# Patient Record
Sex: Male | Born: 1940 | Race: White | Hispanic: No | Marital: Married | Smoking: Never smoker
Health system: Southern US, Community
[De-identification: ages and names within clinical notes are randomized; demographics above are authoritative.]

## PROBLEM LIST (undated history)

## (undated) DIAGNOSIS — T7840XA Allergy, unspecified, initial encounter: Secondary | ICD-10-CM

## (undated) DIAGNOSIS — K219 Gastro-esophageal reflux disease without esophagitis: Secondary | ICD-10-CM

## (undated) DIAGNOSIS — K8020 Calculus of gallbladder without cholecystitis without obstruction: Secondary | ICD-10-CM

## (undated) DIAGNOSIS — I10 Essential (primary) hypertension: Secondary | ICD-10-CM

## (undated) DIAGNOSIS — E119 Type 2 diabetes mellitus without complications: Secondary | ICD-10-CM

## (undated) DIAGNOSIS — K635 Polyp of colon: Secondary | ICD-10-CM

## (undated) DIAGNOSIS — R011 Cardiac murmur, unspecified: Secondary | ICD-10-CM

## (undated) DIAGNOSIS — E785 Hyperlipidemia, unspecified: Secondary | ICD-10-CM

## (undated) DIAGNOSIS — C801 Malignant (primary) neoplasm, unspecified: Secondary | ICD-10-CM

## (undated) DIAGNOSIS — N189 Chronic kidney disease, unspecified: Secondary | ICD-10-CM

## (undated) DIAGNOSIS — M1990 Unspecified osteoarthritis, unspecified site: Secondary | ICD-10-CM

## (undated) DIAGNOSIS — I2510 Atherosclerotic heart disease of native coronary artery without angina pectoris: Secondary | ICD-10-CM

## (undated) HISTORY — DX: Allergy, unspecified, initial encounter: T78.40XA

## (undated) HISTORY — DX: Essential (primary) hypertension: I10

## (undated) HISTORY — DX: Calculus of gallbladder without cholecystitis without obstruction: K80.20

## (undated) HISTORY — DX: Hyperlipidemia, unspecified: E78.5

## (undated) HISTORY — DX: Polyp of colon: K63.5

## (undated) HISTORY — DX: Gastro-esophageal reflux disease without esophagitis: K21.9

---

## 1998-11-29 HISTORY — PX: CORONARY ANGIOPLASTY WITH STENT PLACEMENT: SHX49

## 2009-11-29 DIAGNOSIS — C801 Malignant (primary) neoplasm, unspecified: Secondary | ICD-10-CM

## 2009-11-29 HISTORY — DX: Malignant (primary) neoplasm, unspecified: C80.1

## 2010-04-23 ENCOUNTER — Inpatient Hospital Stay: Payer: Self-pay | Admitting: Internal Medicine

## 2010-06-29 ENCOUNTER — Ambulatory Visit: Payer: Self-pay | Admitting: Oncology

## 2010-07-08 ENCOUNTER — Inpatient Hospital Stay: Payer: Self-pay | Admitting: Surgery

## 2010-07-15 HISTORY — PX: COLON SURGERY: SHX602

## 2010-07-15 LAB — CEA: CEA: 4.6 ng/mL (ref 0.0–4.7)

## 2010-07-22 ENCOUNTER — Ambulatory Visit: Payer: Self-pay | Admitting: Oncology

## 2010-07-30 ENCOUNTER — Ambulatory Visit: Payer: Self-pay | Admitting: Oncology

## 2010-08-10 ENCOUNTER — Ambulatory Visit: Payer: Self-pay | Admitting: Oncology

## 2010-08-25 ENCOUNTER — Ambulatory Visit: Payer: Self-pay | Admitting: Surgery

## 2010-08-29 ENCOUNTER — Ambulatory Visit: Payer: Self-pay | Admitting: Oncology

## 2010-09-29 ENCOUNTER — Ambulatory Visit: Payer: Self-pay | Admitting: Oncology

## 2010-10-29 ENCOUNTER — Ambulatory Visit: Payer: Self-pay | Admitting: Oncology

## 2010-11-29 ENCOUNTER — Ambulatory Visit: Payer: Self-pay | Admitting: Oncology

## 2010-12-30 ENCOUNTER — Ambulatory Visit: Payer: Self-pay | Admitting: Oncology

## 2011-01-28 ENCOUNTER — Ambulatory Visit: Payer: Self-pay | Admitting: Oncology

## 2011-02-28 ENCOUNTER — Ambulatory Visit: Payer: Self-pay | Admitting: Oncology

## 2011-03-31 ENCOUNTER — Ambulatory Visit: Payer: Self-pay | Admitting: Oncology

## 2011-04-30 ENCOUNTER — Ambulatory Visit: Payer: Self-pay | Admitting: Oncology

## 2011-05-13 LAB — CEA: CEA: 2.4 ng/mL (ref 0.0–4.7)

## 2011-05-30 ENCOUNTER — Ambulatory Visit: Payer: Self-pay | Admitting: Oncology

## 2011-06-30 ENCOUNTER — Ambulatory Visit: Payer: Self-pay | Admitting: Oncology

## 2011-08-04 ENCOUNTER — Ambulatory Visit: Payer: Self-pay | Admitting: Oncology

## 2011-08-05 LAB — CEA: CEA: 2.3 ng/mL (ref 0.0–4.7)

## 2011-08-30 ENCOUNTER — Ambulatory Visit: Payer: Self-pay | Admitting: Oncology

## 2011-09-30 ENCOUNTER — Ambulatory Visit: Payer: Self-pay | Admitting: Oncology

## 2011-11-02 ENCOUNTER — Ambulatory Visit: Payer: Self-pay | Admitting: Gastroenterology

## 2011-11-02 DIAGNOSIS — K635 Polyp of colon: Secondary | ICD-10-CM

## 2011-11-02 HISTORY — DX: Polyp of colon: K63.5

## 2011-11-02 HISTORY — PX: COLONOSCOPY: SHX174

## 2011-11-05 ENCOUNTER — Ambulatory Visit: Payer: Self-pay | Admitting: Oncology

## 2011-11-30 ENCOUNTER — Ambulatory Visit: Payer: Self-pay | Admitting: Oncology

## 2011-12-31 ENCOUNTER — Ambulatory Visit: Payer: Self-pay | Admitting: Oncology

## 2012-02-03 ENCOUNTER — Ambulatory Visit: Payer: Self-pay | Admitting: Oncology

## 2012-02-03 LAB — CBC CANCER CENTER
Basophil #: 0 x10 3/mm (ref 0.0–0.1)
Eosinophil #: 0.1 x10 3/mm (ref 0.0–0.7)
Eosinophil %: 2.4 %
HCT: 37 % — ABNORMAL LOW (ref 40.0–52.0)
HGB: 12.6 g/dL — ABNORMAL LOW (ref 13.0–18.0)
Lymphocyte %: 18.4 %
MCH: 32.3 pg (ref 26.0–34.0)
MCHC: 34.2 g/dL (ref 32.0–36.0)
Monocyte #: 0.4 x10 3/mm (ref 0.0–0.7)
Neutrophil %: 68.9 %
Platelet: 145 x10 3/mm — ABNORMAL LOW (ref 150–440)
RBC: 3.92 10*6/uL — ABNORMAL LOW (ref 4.40–5.90)
RDW: 14.3 % (ref 11.5–14.5)
WBC: 4.4 x10 3/mm (ref 3.8–10.6)

## 2012-02-03 LAB — COMPREHENSIVE METABOLIC PANEL
Albumin: 4 g/dL (ref 3.4–5.0)
Anion Gap: 8 (ref 7–16)
Calcium, Total: 8.8 mg/dL (ref 8.5–10.1)
Chloride: 105 mmol/L (ref 98–107)
Creatinine: 1.8 mg/dL — ABNORMAL HIGH (ref 0.60–1.30)
EGFR (African American): 49 — ABNORMAL LOW
Glucose: 99 mg/dL (ref 65–99)
SGOT(AST): 23 U/L (ref 15–37)
SGPT (ALT): 26 U/L

## 2012-02-28 ENCOUNTER — Ambulatory Visit: Payer: Self-pay | Admitting: Oncology

## 2012-04-18 ENCOUNTER — Ambulatory Visit: Payer: Self-pay | Admitting: Surgery

## 2012-04-18 DIAGNOSIS — I2510 Atherosclerotic heart disease of native coronary artery without angina pectoris: Secondary | ICD-10-CM

## 2012-04-18 LAB — BASIC METABOLIC PANEL
BUN: 20 mg/dL — ABNORMAL HIGH (ref 7–18)
Calcium, Total: 8.9 mg/dL (ref 8.5–10.1)
Chloride: 109 mmol/L — ABNORMAL HIGH (ref 98–107)
Co2: 26 mmol/L (ref 21–32)
Creatinine: 1.94 mg/dL — ABNORMAL HIGH (ref 0.60–1.30)
EGFR (Non-African Amer.): 34 — ABNORMAL LOW
Glucose: 124 mg/dL — ABNORMAL HIGH (ref 65–99)
Osmolality: 289 (ref 275–301)
Potassium: 4.8 mmol/L (ref 3.5–5.1)
Sodium: 143 mmol/L (ref 136–145)

## 2012-04-18 LAB — CBC WITH DIFFERENTIAL/PLATELET
Basophil #: 0 10*3/uL (ref 0.0–0.1)
Basophil %: 0.4 %
Eosinophil #: 0.1 10*3/uL (ref 0.0–0.7)
Eosinophil %: 2.7 %
HCT: 37.8 % — ABNORMAL LOW (ref 40.0–52.0)
HGB: 12.9 g/dL — ABNORMAL LOW (ref 13.0–18.0)
Lymphocyte #: 0.8 10*3/uL — ABNORMAL LOW (ref 1.0–3.6)
Lymphocyte %: 16.8 %
MCH: 31.7 pg (ref 26.0–34.0)
MCHC: 34.1 g/dL (ref 32.0–36.0)
Monocyte #: 0.5 x10 3/mm (ref 0.2–1.0)
Monocyte %: 9 %
Neutrophil %: 71.1 %
Platelet: 136 10*3/uL — ABNORMAL LOW (ref 150–440)
RDW: 13.4 % (ref 11.5–14.5)
WBC: 5 10*3/uL (ref 3.8–10.6)

## 2012-04-27 ENCOUNTER — Ambulatory Visit: Payer: Self-pay | Admitting: Surgery

## 2015-03-23 NOTE — H&P (Signed)
PATIENT NAME:  Robert Hansen, ROTHWELL MR#:  [id number] DATE OF BIRTH:  [birth year]  DATE OF ADMISSION:  02/29/2012  CHIEF COMPLAINT: Port removal.  HISTORY OF PRESENT ILLNESS: This is a patient with colon cancer and anemia. He was diagnosed back in 2011. H has undergone and completed chemotherapy following surgery for an obstructing and bleeding colon cancer. He is here for elective port removal.  PAST MEDICAL HISTORY:  1. Sigmoid colon cancer. 2. Hematochezia.  3. Anemia. 4. Hypertension. 5. Coronary artery disease with recent stents in March 2012. 6. Chronic renal insufficiency.  7. Hyperlipidemia.  8. Gastroesophageal reflux disease.   PAST SURGICAL HISTORY:  1. Port placement. 2. Colon resection.   MEDICATIONS: Aspirin and Plavix; for others see chart.   FAMILY HISTORY: Noncontributory.   SOCIAL HISTORY: The patient no longer smokes.   REVIEW OF SYSTEMS: A 10 system review has been documented, in the chart, in the office.   PHYSICAL EXAMINATION:   GENERAL: Healthy, thin male patient.   HEENT: No scleral icterus.   NECK: No palpable neck nodes.   LUNGS: Chest is clear to auscultation.   HEART: Cardiac examination revealed a regular rate and rhythm.   ABDOMEN: Soft and nontender. There are scars present.  EXTREMITIES. No edema. Calves are nontender.   NEUROLOGIC: Grossly intact.   INTEGUMENT: No jaundice.   CHEST: There is a port present in the anterior chest.   ASSESSMENT AND PLAN: This is a patient with a port and he is asking for removal. He has stopped his Plavix. He is over a year out from his stent placement. The rationale for offering surgery has been discussed. The risks of bleeding, infection, and cosmetic deformity including scar have been reviewed. He understood and agreed to proceed. ____________________________ Jerrol Banana Burt Knack, MD rec:slb D: 02/28/2012 13:42:50 ET T: 02/28/2012 14:22:56 ET JOB#: [id number]  cc: Jerrol Banana. Burt Knack, MD, <Dictator> Florene Glen MD ELECTRONICALLY SIGNED 02/29/2012 9:55

## 2015-03-23 NOTE — Op Note (Signed)
PATIENT NAME:  Robert Hansen, Robert Hansen MR#:  [id number] DATE OF BIRTH:  September 14, 1941  DATE OF PROCEDURE:  04/27/2012  PREOPERATIVE DIAGNOSIS: Colon cancer.   POSTOPERATIVE DIAGNOSIS: Colon cancer.  PROCEDURE: Port removal.   SURGEON: Phoebe Perch, MD  ANESTHESIA: Monitored anesthesia care.   INDICATION: This is a patient who has completed chemotherapy and wishes port removal. Preoperatively we discussed rationale for surgery, the options of observation, risk of bleeding, infection, recurrence, and the need for another port. He understood and agreed to proceed.   FINDINGS: Port removal and sent for gross only.   DESCRIPTION OF PROCEDURE: The patient was induced to monitored anesthesia care, prepped and draped in a sterile fashion. A local anesthetic was infiltrated in the skin and subcutaneous tissues around the palpable port. An incision was made. Dissection down the port was performed. Prolene sutures were cut, the port was removed, and pressure was held. Additional Marcaine was placed for a total of 20 mL. Then once assuring that hemostasis was adequate, the wound was closed in layers with deep sutures of 3-0 Vicryl followed by 4-0 subcuticular Monocryl. Steri-Strips, Mastisol, and sterile dressings were placed.            The patient tolerated the procedure well. There were no complications. He was taken to the recovery room in stable condition to be discharged in the care of his family with followup in 10 days. ____________________________ Jerrol Banana Burt Knack, MD rec:slb D: 04/27/2012 07:44:55 ET T: 04/27/2012 11:19:37 ET JOB#: [id number]  cc: Jerrol Banana. Burt Knack, MD, <Dictator> Florene Glen MD ELECTRONICALLY SIGNED 04/27/2012 16:34

## 2015-03-23 NOTE — H&P (Signed)
PATIENT NAME:  Robert Hansen, Robert Hansen MR#:  [id number] DATE OF BIRTH:  03-11-1941  DATE OF ADMISSION:  04/27/2012  CHIEF COMPLAINT: Colon cancer.  HISTORY OF PRESENT ILLNESS: This is a patient here for elective port removal. He has completed his chemotherapy and no longer requires a port and wishes to have it removed. It was placed in 2011.   PAST MEDICAL HISTORY:  1. Sigmoid colon cancer. 2. Hematochezia. 3. Anemia. 4. Hypertension. 5. Coronary artery disease. 6. Renal insufficiency.  7. Hyperlipidemia.  8. Reflux disease.   PAST SURGICAL HISTORY:  1. Port placement.  2. Colon resection.   FAMILY HISTORY: Noncontributory.   SOCIAL HISTORY: Patient stopped smoking.   MEDICATIONS: Aspirin and Plavix which had been stopped for this procedure.   REVIEW OF SYSTEMS: 10 system review has been performed and negative with the exception of that mentioned in the history of present illness.   PHYSICAL EXAMINATION:  GENERAL: Healthy male patient.   HEENT: No scleral icterus.   NECK: No palpable neck nodes.   CHEST: Clear to auscultation. There is a left subclavian port with a well-healed wound.   CARDIAC: Regular rate and rhythm.   ABDOMEN: Soft, nontender.   EXTREMITIES: Without edema. Calves are nontender.   NEUROLOGIC: Grossly intact.   INTEGUMENT: No jaundice. Multiple tattoos.   LABORATORY, DIAGNOSTIC AND RADIOLOGICAL DATA: Laboratory values are within normal limits. Creatinine is elevated at 1.94. (Discussed with the patient, his physicians are aware of that and medications have been changed to address that problem and he is given a copy of these labs for his physician.)   ASSESSMENT AND PLAN: This is a patient for port removal. The rationale for this has been discussed. The options of observation were reviewed and the risks of bleeding, infection, and open wound, and cosmetic deformity have all been reviewed. The potential for having to place a new port was reviewed as well. He  understood and agreed to proceed.  ____________________________ Jerrol Banana Burt Knack, MD rec:cms D: 04/26/2012 13:26:48 ET T: 04/26/2012 13:36:30 ET JOB#: [id number]  cc: Jerrol Banana. Burt Knack, MD, <Dictator> Florene Glen MD ELECTRONICALLY SIGNED 04/27/2012 8:00

## 2015-06-05 DIAGNOSIS — I340 Nonrheumatic mitral (valve) insufficiency: Secondary | ICD-10-CM | POA: Diagnosis not present

## 2015-06-05 DIAGNOSIS — I10 Essential (primary) hypertension: Secondary | ICD-10-CM | POA: Diagnosis not present

## 2015-06-05 DIAGNOSIS — I2510 Atherosclerotic heart disease of native coronary artery without angina pectoris: Secondary | ICD-10-CM | POA: Diagnosis not present

## 2015-06-05 DIAGNOSIS — I252 Old myocardial infarction: Secondary | ICD-10-CM | POA: Diagnosis not present

## 2015-06-05 DIAGNOSIS — K219 Gastro-esophageal reflux disease without esophagitis: Secondary | ICD-10-CM | POA: Diagnosis not present

## 2015-06-11 ENCOUNTER — Ambulatory Visit: Payer: Self-pay | Admitting: Family Medicine

## 2015-07-15 ENCOUNTER — Ambulatory Visit (INDEPENDENT_AMBULATORY_CARE_PROVIDER_SITE_OTHER): Payer: Commercial Managed Care - HMO | Admitting: Family Medicine

## 2015-07-15 ENCOUNTER — Encounter: Payer: Self-pay | Admitting: Family Medicine

## 2015-07-15 VITALS — BP 134/73 | HR 59 | Temp 97.9°F | Resp 19 | Ht 66.0 in | Wt 181.6 lb

## 2015-07-15 DIAGNOSIS — I10 Essential (primary) hypertension: Secondary | ICD-10-CM | POA: Diagnosis not present

## 2015-07-15 DIAGNOSIS — Z9861 Coronary angioplasty status: Secondary | ICD-10-CM | POA: Diagnosis not present

## 2015-07-15 DIAGNOSIS — I2510 Atherosclerotic heart disease of native coronary artery without angina pectoris: Secondary | ICD-10-CM

## 2015-07-15 DIAGNOSIS — E785 Hyperlipidemia, unspecified: Secondary | ICD-10-CM | POA: Diagnosis not present

## 2015-07-15 DIAGNOSIS — R011 Cardiac murmur, unspecified: Secondary | ICD-10-CM | POA: Insufficient documentation

## 2015-07-15 NOTE — Progress Notes (Signed)
Name: Robert Hansen   MRN: [id number]    DOB: 11-12-1941   Date:07/15/2015       Progress Note  Subjective  Chief Complaint  Chief Complaint  Patient presents with  . Follow-up  . Hyperlipidemia  . Hypertension    Hyperlipidemia This is a chronic problem. The problem is controlled. Pertinent negatives include no chest pain, leg pain, myalgias or shortness of breath. Current antihyperlipidemic treatment includes statins. Risk factors for coronary artery disease include dyslipidemia, male sex and hypertension.  Hypertension This is a chronic problem. The problem is controlled. Pertinent negatives include no chest pain, headaches, palpitations or shortness of breath. Past treatments include calcium channel blockers, beta blockers, central alpha agonists and diuretics. Hypertensive end-organ damage includes kidney disease and CAD/MI. There is no history of CVA.      Past Medical History  Diagnosis Date  . Hyperlipidemia   . Allergy   . Hypertension   . GERD (gastroesophageal reflux disease)     History reviewed. No pertinent past surgical history.  Family History  Problem Relation Age of Onset  . Diabetes Mother   . Cancer Mother     breast    Social History   Social History  . Marital Status: Married    Spouse Name: N/A  . Number of Children: N/A  . Years of Education: N/A   Occupational History  . Not on file.   Social History Main Topics  . Smoking status: Never Smoker   . Smokeless tobacco: Never Used  . Alcohol Use: 0.0 oz/week    0 Standard drinks or equivalent per week     Comment: occasional  . Drug Use: No  . Sexual Activity: Not on file   Other Topics Concern  . Not on file   Social History Narrative  . No narrative on file     Current outpatient prescriptions:  .  amLODipine (NORVASC) 5 MG tablet, Take 1 tablet by mouth daily., Disp: , Rfl:  .  aspirin 81 MG tablet, Take by mouth., Disp: , Rfl:  .  clopidogrel (PLAVIX) 75 MG tablet, Take 1  tablet by mouth daily., Disp: , Rfl:  .  hydrALAZINE (APRESOLINE) 100 MG tablet, Take 1 tablet by mouth 2 (two) times daily., Disp: , Rfl:  .  hydrochlorothiazide (HYDRODIURIL) 12.5 MG tablet, Take 1 tablet by mouth daily., Disp: , Rfl:  .  metoprolol tartrate (LOPRESSOR) 25 MG tablet, Take 1 tablet by mouth 2 (two) times daily., Disp: , Rfl:  .  nitroGLYCERIN (NITROSTAT) 0.4 MG SL tablet, Place under the tongue., Disp: , Rfl:  .  Omega-3 Fatty Acids (FISH OIL) 1200 MG CPDR, Take 1 capsule by mouth 2 (two) times daily., Disp: , Rfl:  .  simvastatin (ZOCOR) 40 MG tablet, Take 1 tablet by mouth at bedtime., Disp: , Rfl:   Allergies  Allergen Reactions  . Codeine Nausea And Vomiting  . Morphine Nausea Only  . Simvastatin      Review of Systems  Respiratory: Negative for shortness of breath.   Cardiovascular: Negative for chest pain and palpitations.  Musculoskeletal: Negative for myalgias.  Neurological: Negative for headaches.      Objective  Filed Vitals:   07/15/15 0805  BP: 134/73  Pulse: 59  Temp: 97.9 F (36.6 C)  TempSrc: Oral  Resp: 19  Height: 5\' 6"  (1.676 m)  Weight: 181 lb 9.6 oz (82.373 kg)  SpO2: 94%    Physical Exam  Constitutional: He is oriented to person,  place, and time and well-developed, well-nourished, and in no distress.  Cardiovascular: Normal rate and regular rhythm.   Murmur heard. Pulmonary/Chest: Effort normal and breath sounds normal.  Neurological: He is alert and oriented to person, place, and time.  Nursing note and vitals reviewed.   Assessment & Plan  1. Essential hypertension Blood pressure is at goal. Continue present therapy.  2. Hyperlipidemia Recheck lipids and liver enzymes today. - Lipid Profile - Comprehensive metabolic panel  3. CAD S/P percutaneous coronary angioplasty Followed by cardiology.   [name] [name] 07/15/2015 8:29 AM

## 2015-07-17 ENCOUNTER — Emergency Department
Admission: EM | Admit: 2015-07-17 | Discharge: 2015-07-17 | Disposition: A | Discharge status: Home / Self Care | Payer: Commercial Managed Care - HMO | Attending: Student | Admitting: Student

## 2015-07-17 ENCOUNTER — Emergency Department: Payer: Commercial Managed Care - HMO

## 2015-07-17 ENCOUNTER — Encounter: Payer: Self-pay | Admitting: Emergency Medicine

## 2015-07-17 ENCOUNTER — Other Ambulatory Visit: Payer: Self-pay

## 2015-07-17 DIAGNOSIS — Z7982 Long term (current) use of aspirin: Secondary | ICD-10-CM | POA: Diagnosis not present

## 2015-07-17 DIAGNOSIS — I10 Essential (primary) hypertension: Secondary | ICD-10-CM | POA: Diagnosis not present

## 2015-07-17 DIAGNOSIS — N400 Enlarged prostate without lower urinary tract symptoms: Secondary | ICD-10-CM | POA: Diagnosis not present

## 2015-07-17 DIAGNOSIS — R109 Unspecified abdominal pain: Secondary | ICD-10-CM | POA: Diagnosis present

## 2015-07-17 DIAGNOSIS — Z7902 Long term (current) use of antithrombotics/antiplatelets: Secondary | ICD-10-CM | POA: Diagnosis not present

## 2015-07-17 DIAGNOSIS — Z79899 Other long term (current) drug therapy: Secondary | ICD-10-CM | POA: Diagnosis not present

## 2015-07-17 DIAGNOSIS — K8020 Calculus of gallbladder without cholecystitis without obstruction: Secondary | ICD-10-CM | POA: Insufficient documentation

## 2015-07-17 DIAGNOSIS — Z85038 Personal history of other malignant neoplasm of large intestine: Secondary | ICD-10-CM | POA: Diagnosis not present

## 2015-07-17 DIAGNOSIS — R1011 Right upper quadrant pain: Secondary | ICD-10-CM | POA: Diagnosis not present

## 2015-07-17 DIAGNOSIS — K4090 Unilateral inguinal hernia, without obstruction or gangrene, not specified as recurrent: Secondary | ICD-10-CM | POA: Diagnosis not present

## 2015-07-17 HISTORY — DX: Malignant (primary) neoplasm, unspecified: C80.1

## 2015-07-17 LAB — CBC
HCT: 42.7 % (ref 40.0–52.0)
Hemoglobin: 14.5 g/dL (ref 13.0–18.0)
MCH: 31.1 pg (ref 26.0–34.0)
MCHC: 33.9 g/dL (ref 32.0–36.0)
MCV: 91.9 fL (ref 80.0–100.0)
PLATELETS: 140 10*3/uL — AB (ref 150–440)
RBC: 4.64 MIL/uL (ref 4.40–5.90)
RDW: 13.2 % (ref 11.5–14.5)
WBC: 7.7 10*3/uL (ref 3.8–10.6)

## 2015-07-17 LAB — COMPREHENSIVE METABOLIC PANEL
ALT: 18 U/L (ref 17–63)
AST: 30 U/L (ref 15–41)
Albumin: 4.6 g/dL (ref 3.5–5.0)
Alkaline Phosphatase: 60 U/L (ref 38–126)
Anion gap: 9 (ref 5–15)
BILIRUBIN TOTAL: 0.4 mg/dL (ref 0.3–1.2)
BUN: 23 mg/dL — ABNORMAL HIGH (ref 6–20)
CALCIUM: 9.9 mg/dL (ref 8.9–10.3)
CHLORIDE: 102 mmol/L (ref 101–111)
CO2: 28 mmol/L (ref 22–32)
CREATININE: 1.72 mg/dL — AB (ref 0.61–1.24)
GFR, EST AFRICAN AMERICAN: 44 mL/min — AB (ref >60)
GFR, EST NON AFRICAN AMERICAN: 38 mL/min — AB (ref >60)
Glucose, Bld: 152 mg/dL — ABNORMAL HIGH (ref 65–99)
Potassium: 3.8 mmol/L (ref 3.5–5.1)
Sodium: 139 mmol/L (ref 135–145)
TOTAL PROTEIN: 7.9 g/dL (ref 6.5–8.1)

## 2015-07-17 LAB — LIPASE, BLOOD: LIPASE: 17 U/L — AB (ref 22–51)

## 2015-07-17 MED ORDER — IOHEXOL 300 MG/ML  SOLN
100.0000 mL | Freq: Once | INTRAMUSCULAR | Status: AC | PRN
  Administered 2015-07-17: 100 mL via INTRAVENOUS

## 2015-07-17 MED ORDER — IOHEXOL 240 MG/ML SOLN
25.0000 mL | Freq: Once | INTRAMUSCULAR | Status: AC | PRN
  Administered 2015-07-17: 25 mL via ORAL

## 2015-07-17 MED ORDER — ONDANSETRON 4 MG PO TBDP: 4.0000 mg | ORAL_TABLET | Freq: Three times a day (TID) | ORAL | Status: DC | PRN

## 2015-07-17 MED ORDER — SODIUM CHLORIDE 0.9 % IV BOLUS (SEPSIS)
500.0000 mL | Freq: Once | INTRAVENOUS | Status: AC
  Administered 2015-07-17: 500 mL via INTRAVENOUS

## 2015-07-17 MED ORDER — OXYCODONE HCL 5 MG PO TABS
5.0000 mg | ORAL_TABLET | Freq: Four times a day (QID) | ORAL | Status: DC | PRN
Start: 2015-07-17 — End: 2016-03-15

## 2015-07-17 NOTE — ED Notes (Signed)
Awoke from sleep this AM with RUQ abd pain ,increased pain on palpation, no urinary symptoms , LAST BM today (normal).

## 2015-07-17 NOTE — ED Provider Notes (Addendum)
[facility] Emergency Department Provider Note  ____________________________________________  Time seen: Approximately 9:59 AM  I have reviewed the triage vital signs and the nursing notes.   HISTORY  Chief Complaint Abdominal Pain    HPI Robert Hansen is a 74 y.o. male with history of coronary artery disease, hypertension, hyperlipidemia presents for evaluation of sudden onset right-sided abdominal pain, sharp, constant since onset at 4 AM. No chest pain or difficulty breathing. No nausea, vomiting, diarrhea, fevers or chills. No modifying factors. Current severity of symptoms is moderate to severe. He reports he had this pain before but it went away by itself.   Past Medical History  Diagnosis Date  . Hyperlipidemia   . Allergy   . Hypertension   . GERD (gastroesophageal reflux disease)   . Cancer     Patient Active Problem List   Diagnosis Date Noted  . Hypertension 07/15/2015  . Hyperlipidemia 07/15/2015  . Cardiac murmur 07/15/2015  . CAD S/P percutaneous coronary angioplasty 07/15/2015    History reviewed. No pertinent past surgical history.  Current Outpatient Rx  Name  Route  Sig  Dispense  Refill  . aspirin 81 MG tablet   Oral   Take by mouth.         . clopidogrel (PLAVIX) 75 MG tablet   Oral   Take 1 tablet by mouth daily.         . hydrALAZINE (APRESOLINE) 100 MG tablet   Oral   Take 1 tablet by mouth 2 (two) times daily.         . hydrochlorothiazide (HYDRODIURIL) 12.5 MG tablet   Oral   Take 1 tablet by mouth daily.         . metoprolol tartrate (LOPRESSOR) 25 MG tablet   Oral   Take 1 tablet by mouth 2 (two) times daily.         . Omega-3 Fatty Acids (FISH OIL) 1200 MG CPDR   Oral   Take 1 capsule by mouth 2 (two) times daily.         . simvastatin (ZOCOR) 40 MG tablet   Oral   Take 1 tablet by mouth at bedtime.         Marland Kitchen amLODipine (NORVASC) 5 MG tablet   Oral   Take 1 tablet by mouth daily.         . nitroGLYCERIN (NITROSTAT) 0.4 MG SL tablet   Sublingual   Place under the tongue.           Allergies Codeine and Morphine  Family History  Problem Relation Age of Onset  . Diabetes Mother   . Cancer Mother     breast    Social History Social History  Substance Use Topics  . Smoking status: Never Smoker   . Smokeless tobacco: Never Used  . Alcohol Use: 0.0 oz/week    0 Standard drinks or equivalent per week     Comment: occasional    Review of Systems Constitutional: No fever/chills Eyes: No visual changes. ENT: No sore throat. Cardiovascular: Denies chest pain. Respiratory: Denies shortness of breath. Gastrointestinal: + abdominal pain.  No nausea, no vomiting.  No diarrhea.  No constipation. Genitourinary: Negative for dysuria. Musculoskeletal: Negative for back pain. Skin: Negative for rash. Neurological: Negative for headaches, focal weakness or numbness.  10-point ROS otherwise negative.  ____________________________________________   PHYSICAL EXAM:  VITAL SIGNS: ED Triage Vitals  Enc Vitals Group     BP 07/17/15 0932 167/82 mmHg  Pulse Rate 07/17/15 0932 58     Resp 07/17/15 0932 18     Temp 07/17/15 0932 97.7 F (36.5 C)     Temp Source 07/17/15 0932 Oral     SpO2 07/17/15 0932 98 %     Weight 07/17/15 0932 181 lb (82.101 kg)     Height 07/17/15 0932 5\' 6"  (1.676 m)     Head Cir --      Peak Flow --      Pain Score 07/17/15 0931 7     Pain Loc --      Pain Edu? --      Excl. in [place]? --     Constitutional: Alert and oriented. Nontoxic appearing, in mild distress secondary to pain.. Eyes: Conjunctivae are normal. PERRL. EOMI. Head: Atraumatic. Nose: No congestion/rhinnorhea. Mouth/Throat: Mucous membranes are moist.  Oropharynx non-erythematous. Neck: No stridor.   Cardiovascular: Normal rate, regular rhythm. Grossly normal heart sounds.  Good peripheral circulation. Respiratory: Normal respiratory effort.  No retractions.  Lungs CTAB. Gastrointestinal: Soft with moderate tenderness to palpation in the right upper quadrant, right mid abdomen, right lower quadrant. No rebound or guarding. Normal bowel sounds. Genitourinary: deferred Musculoskeletal: No lower extremity tenderness nor edema.  No joint effusions. Neurologic:  Normal speech and language. No gross focal neurologic deficits are appreciated. No gait instability. Skin:  Skin is warm, dry and intact. No rash noted. Psychiatric: Mood and affect are normal. Speech and behavior are normal.  ____________________________________________   LABS (all labs ordered are listed, but only abnormal results are displayed)  Labs Reviewed  LIPASE, BLOOD - Abnormal; Notable for the following:    Lipase 17 (*)    All other components within normal limits  COMPREHENSIVE METABOLIC PANEL - Abnormal; Notable for the following:    Glucose, Bld 152 (*)    BUN 23 (*)    Creatinine, Ser 1.72 (*)    GFR calc non Af Amer 38 (*)    GFR calc Af Amer 44 (*)    All other components within normal limits  CBC - Abnormal; Notable for the following:    Platelets 140 (*)    All other components within normal limits  URINALYSIS COMPLETEWITH MICROSCOPIC ([facility] ONLY)   ____________________________________________  EKG  ED ECG REPORT I, Joanne Gavel, the attending physician, personally viewed and interpreted this ECG.   Date: 07/17/2015  EKG Time: 10:57  Rate: 51  Rhythm: sinus bradycardia  Axis: left  Intervals:none  ST&T Change: No acute ST elevation. Q waves in lead V2. T wave inversion in lead 3 and aVF.  ____________________________________________  RADIOLOGY  CT abdomen and pelvis IMPRESSION: No acute finding abdomen or pelvis. No abnormality to explain the patient's symptoms.  Gallstones without evidence of cholecystitis.  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 Cause for this finding is not identified but it may be ischemic  in nature.  Mild diverticulosis without diverticulitis.  Prostatomegaly.  Small fat containing inguinal hernias, larger on the right.  ____________________________________________   PROCEDURES  Procedure(s) performed: None  Critical Care performed: No  ____________________________________________   INITIAL IMPRESSION / ASSESSMENT AND PLAN / ED COURSE  Pertinent labs & imaging results that were available during my care of the patient were reviewed by me and considered in my medical decision making (see chart for details).  Robert Hansen is a 75 y.o. male with history of coronary artery disease, hypertension, hyperlipidemia presents for evaluation of sudden onset right-sided abdominal pain,  sharp, constant since onset at 4 AM. On exam, he is in mild distress secondary to pain. He has diffuse tenderness to palpation throughout the right abdomen worse in the right upper quadrant and right midabdomen. He reports severe hallucinations related to morphine, codeine and is declining any pain medications at this time. Plan for screening labs, EKG, CT of the abdomen and pelvis to further elucidate the cause of his pain.  ----------------------------------------- 3:34 PM on 07/17/2015 -----------------------------------------  Patient is resting comfortably at this time with significant improvement in pain. He has not accepted any medication for pain and despite this, he is still sleeping. I woke him to discuss CT findings which are positive for gallstones though there is no evidence of cholecystitis. I discussed with him the diagnosis of symptomatic cholelithiasis which I think is the most likely cause of this pain today. CBC within normal limits, normal lipase, CMP notable for creatinine elevation at 1.72 however this appears to be chronic and stable. EKG was notable for some T-wave inversions in lead 3 and aVF however he does have a history of coronary artery disease. Given his abdominal  tenderness and the finding of cholelithiasis on CT, I doubt acute cardiac ischemia/ACS as the cause of his pain. We discussed symptomatic management with oxycodone, return precautions, outpatient surgical follow-up. He and his wife at bedside are comfortable with the discharge plan.  ____________________________________________   FINAL CLINICAL IMPRESSION(S) / ED DIAGNOSES  Final diagnoses:  Right sided abdominal pain  Calculus of gallbladder without cholecystitis without obstruction      Joanne Gavel, MD 07/17/15 1537  Joanne Gavel, MD 07/17/15 1539

## 2015-07-17 NOTE — ED Notes (Signed)
Patient to ED with c/o right side abdominal pain that started around 4am, reports some nausea, has had problem in past took Tums without relief.

## 2015-07-17 NOTE — ED Notes (Signed)
MD at bedside. Dr.Gayle at bedside

## 2015-07-21 ENCOUNTER — Encounter: Payer: Self-pay | Admitting: *Deleted

## 2015-07-23 ENCOUNTER — Encounter: Payer: Self-pay | Admitting: Surgery

## 2015-07-23 ENCOUNTER — Ambulatory Visit (INDEPENDENT_AMBULATORY_CARE_PROVIDER_SITE_OTHER): Payer: Commercial Managed Care - HMO | Admitting: Surgery

## 2015-07-23 VITALS — BP 149/83 | HR 48 | Temp 98.0°F | Ht 66.0 in | Wt 179.0 lb

## 2015-07-23 DIAGNOSIS — K8050 Calculus of bile duct without cholangitis or cholecystitis without obstruction: Secondary | ICD-10-CM

## 2015-07-23 NOTE — Progress Notes (Signed)
Patient ID: Robert Hansen, male   DOB: 09-Jun-1941, 74 y.o.   MRN: [id number]  Chief Complaint  Patient presents with  . Cholelithiasis    surgical consult    HPI Location, Quality, Duration, Severity, Timing, Context, Modifying Factors, Associated Signs and Symptoms.  Robert Hansen is a 74 y.o. male.  With a history of  sigmoid colon cancer in remission who presented to the emergency room recently with the sudden onset of epigastric and right upper quadrant abdominal pain on August 18. Workup mistreated a CT scan of the abdomen and pelvis with cholelithiasis. Liver function tests were normal. Lipase was normal. He had symptomatic relief in the emergency room and was discharged home with outpatient follow-up in our office.  Since discharge the patient is had no symptoms. He's had no further pain. No nausea no vomiting. No fever no jaundice.   Past Medical History  Diagnosis Date  . Hyperlipidemia   . Allergy   . Hypertension   . GERD (gastroesophageal reflux disease)   . Cancer   . Cholelithiasis     Past Surgical History  Procedure Laterality Date  . Colon surgery    . Coronary angioplasty with stent placement      Family History  Problem Relation Age of Onset  . Diabetes Mother   . Cancer Mother     breast    Social History Social History  Substance Use Topics  . Smoking status: Never Smoker   . Smokeless tobacco: Never Used  . Alcohol Use: 0.0 oz/week    0 Standard drinks or equivalent per week     Comment: occasional    Allergies  Allergen Reactions  . Codeine Nausea And Vomiting  . Morphine Nausea Only    Current Outpatient Prescriptions  Medication Sig Dispense Refill  . amLODipine (NORVASC) 5 MG tablet Take 1 tablet by mouth daily.    Marland Kitchen aspirin 81 MG tablet Take by mouth.    . clopidogrel (PLAVIX) 75 MG tablet Take 1 tablet by mouth daily.    . hydrALAZINE (APRESOLINE) 100 MG tablet Take 1 tablet by mouth 2 (two) times daily.    . hydrochlorothiazide  (HYDRODIURIL) 12.5 MG tablet Take 1 tablet by mouth daily.    . metoprolol tartrate (LOPRESSOR) 25 MG tablet Take 1 tablet by mouth 2 (two) times daily.    . nitroGLYCERIN (NITROSTAT) 0.4 MG SL tablet Place under the tongue.    . Omega-3 Fatty Acids (FISH OIL) 1200 MG CPDR Take 1 capsule by mouth 2 (two) times daily.    . ondansetron (ZOFRAN ODT) 4 MG disintegrating tablet Take 1 tablet (4 mg total) by mouth every 8 (eight) hours as needed for nausea or vomiting. 12 tablet 0  . oxyCODONE (ROXICODONE) 5 MG immediate release tablet Take 1 tablet (5 mg total) by mouth every 6 (six) hours as needed for moderate pain. Do not drive while taking this medication. 12 tablet 0  . simvastatin (ZOCOR) 40 MG tablet Take 1 tablet by mouth at bedtime.     No current facility-administered medications for this visit.     Blood pressure 149/83, pulse 48, temperature 98 F (36.7 C), temperature source Oral, height 5\' 6"  (1.676 m), weight 179 lb (81.194 kg).   Review of Systems  Constitutional: Negative.   Eyes: Negative.   Cardiovascular: Negative.   Gastrointestinal: Negative.   Genitourinary: Negative.   All other systems reviewed and are negative.   Physical Exam  Constitutional: He is oriented to person,  place, and time and well-developed, well-nourished, and in no distress. No distress.  HENT:  Head: Normocephalic and atraumatic.  Eyes: Pupils are equal, round, and reactive to light.  Neck: Normal range of motion.  Cardiovascular: Normal rate and regular rhythm.   Pulmonary/Chest: Effort normal and breath sounds normal.  Abdominal:    Neurological: He is alert and oriented to person, place, and time.  Skin: Skin is dry. He is not diaphoretic.  Psychiatric: Mood, memory, affect and judgment normal.    Data Reviewed CT scan of the abdomen and pelvis dated August 18 was personally reviewed on the PACS monitor. There is evidence of cholelithiasis and no evidence of recurrent carcinoma or  hepatic metastases.  I have personally reviewed the patient's imaging, laboratory findings and medical records.    Assessment    74 year old active white male with a history of cholecystitis with one episode. Colon cancer in remission.    Plan    I discussed with him the likelihood of recurrent cholecystitis and biliary colic. He understands. 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 He will contact our office when he is ready to undergo laparoscopic cholecystectomy which we described briefly.      Sherri Rad MD, FACS 07/23/2015, 1:03 PM

## 2015-11-06 DIAGNOSIS — K219 Gastro-esophageal reflux disease without esophagitis: Secondary | ICD-10-CM | POA: Diagnosis not present

## 2015-11-06 DIAGNOSIS — I10 Essential (primary) hypertension: Secondary | ICD-10-CM | POA: Diagnosis not present

## 2015-11-06 DIAGNOSIS — I2510 Atherosclerotic heart disease of native coronary artery without angina pectoris: Secondary | ICD-10-CM | POA: Diagnosis not present

## 2015-11-06 DIAGNOSIS — I340 Nonrheumatic mitral (valve) insufficiency: Secondary | ICD-10-CM | POA: Diagnosis not present

## 2015-11-17 ENCOUNTER — Ambulatory Visit: Payer: Commercial Managed Care - HMO | Admitting: Family Medicine

## 2015-12-03 ENCOUNTER — Ambulatory Visit (INDEPENDENT_AMBULATORY_CARE_PROVIDER_SITE_OTHER): Payer: Commercial Managed Care - HMO | Admitting: Family Medicine

## 2015-12-03 ENCOUNTER — Encounter: Payer: Self-pay | Admitting: Family Medicine

## 2015-12-03 VITALS — BP 116/64 | HR 56 | Temp 98.1°F | Resp 16 | Ht 66.0 in | Wt 184.1 lb

## 2015-12-03 DIAGNOSIS — R739 Hyperglycemia, unspecified: Secondary | ICD-10-CM | POA: Diagnosis not present

## 2015-12-03 DIAGNOSIS — R011 Cardiac murmur, unspecified: Secondary | ICD-10-CM | POA: Diagnosis not present

## 2015-12-03 DIAGNOSIS — I10 Essential (primary) hypertension: Secondary | ICD-10-CM | POA: Diagnosis not present

## 2015-12-03 DIAGNOSIS — E785 Hyperlipidemia, unspecified: Secondary | ICD-10-CM | POA: Diagnosis not present

## 2015-12-03 DIAGNOSIS — Z9861 Coronary angioplasty status: Secondary | ICD-10-CM

## 2015-12-03 DIAGNOSIS — I2510 Atherosclerotic heart disease of native coronary artery without angina pectoris: Secondary | ICD-10-CM

## 2015-12-03 LAB — POCT GLYCOSYLATED HEMOGLOBIN (HGB A1C): Hemoglobin A1C: 6

## 2015-12-03 NOTE — Progress Notes (Signed)
Name: Robert Hansen   MRN: [id number]    DOB: 14-May-1941   Date:12/03/2015       Progress Note  Subjective  Chief Complaint  Chief Complaint  Patient presents with  . Follow-up    4 mo  . Hypertension  . Hyperlipidemia    Hypertension This is a chronic problem. The problem is controlled. Pertinent negatives include no chest pain, headaches, palpitations or shortness of breath. Past treatments include calcium channel blockers, beta blockers, central alpha agonists and diuretics. Hypertensive end-organ damage includes kidney disease and CAD/MI. There is no history of CVA.  Hyperlipidemia This is a chronic problem. The problem is controlled. Pertinent negatives include no chest pain, leg pain, myalgias or shortness of breath. Current antihyperlipidemic treatment includes statins. Risk factors for coronary artery disease include dyslipidemia, male sex and hypertension.    Past Medical History  Diagnosis Date  . Hyperlipidemia   . Allergy   . Hypertension   . GERD (gastroesophageal reflux disease)   . Cancer ([place])   . Cholelithiasis     Past Surgical History  Procedure Laterality Date  . Colon surgery    . Coronary angioplasty with stent placement      Family History  Problem Relation Age of Onset  . Diabetes Mother   . Cancer Mother     breast    Social History   Social History  . Marital Status: Married    Spouse Name: N/A  . Number of Children: N/A  . Years of Education: N/A   Occupational History  . Not on file.   Social History Main Topics  . Smoking status: Never Smoker   . Smokeless tobacco: Never Used  . Alcohol Use: 0.0 oz/week    0 Standard drinks or equivalent per week     Comment: occasional  . Drug Use: No  . Sexual Activity: Not on file   Other Topics Concern  . Not on file   Social History Narrative     Current outpatient prescriptions:  .  amLODipine (NORVASC) 5 MG tablet, Take 1 tablet by mouth daily., Disp: , Rfl:  .  aspirin 81 MG  tablet, Take by mouth., Disp: , Rfl:  .  clopidogrel (PLAVIX) 75 MG tablet, Take 1 tablet by mouth daily., Disp: , Rfl:  .  hydrALAZINE (APRESOLINE) 100 MG tablet, Take 1 tablet by mouth 2 (two) times daily., Disp: , Rfl:  .  hydrochlorothiazide (HYDRODIURIL) 12.5 MG tablet, Take 1 tablet by mouth daily., Disp: , Rfl:  .  metoprolol tartrate (LOPRESSOR) 25 MG tablet, Take 1 tablet by mouth 2 (two) times daily., Disp: , Rfl:  .  nitroGLYCERIN (NITROSTAT) 0.4 MG SL tablet, Place under the tongue., Disp: , Rfl:  .  Omega-3 Fatty Acids (FISH OIL) 1200 MG CPDR, Take 1 capsule by mouth 2 (two) times daily., Disp: , Rfl:  .  ondansetron (ZOFRAN ODT) 4 MG disintegrating tablet, Take 1 tablet (4 mg total) by mouth every 8 (eight) hours as needed for nausea or vomiting., Disp: 12 tablet, Rfl: 0 .  oxyCODONE (ROXICODONE) 5 MG immediate release tablet, Take 1 tablet (5 mg total) by mouth every 6 (six) hours as needed for moderate pain. Do not drive while taking this medication., Disp: 12 tablet, Rfl: 0 .  simvastatin (ZOCOR) 40 MG tablet, Take 1 tablet by mouth at bedtime., Disp: , Rfl:   Allergies  Allergen Reactions  . Codeine Nausea And Vomiting  . Morphine Nausea Only    Review  of Systems  Respiratory: Negative for shortness of breath.   Cardiovascular: Negative for chest pain and palpitations.  Musculoskeletal: Negative for myalgias.  Neurological: Negative for headaches.     Objective  Filed Vitals:   12/03/15 0813  BP: 116/64  Pulse: 56  Temp: 98.1 F (36.7 C)  TempSrc: Oral  Resp: 16  Height: 5\' 6"  (1.676 m)  Weight: 184 lb 1.6 oz (83.507 kg)  SpO2: 95%    Physical Exam  Constitutional: He is oriented to person, place, and time and well-developed, well-nourished, and in no distress.  HENT:  Head: Normocephalic and atraumatic.  Cardiovascular: Normal rate and regular rhythm.   Murmur (2+ Systolic murmur) heard. Pulmonary/Chest: Effort normal and breath sounds normal. He has  no wheezes.  Abdominal: Soft. Bowel sounds are normal.  Neurological: He is alert and oriented to person, place, and time.  Nursing note and vitals reviewed.    Assessment & Plan  1. Essential hypertension BP stable and at goal. Meds being managed by cardiology.  2. Hyperlipidemia  - Lipid Profile - Comprehensive Metabolic Panel (CMET)  3. Hyperglycemia  - POCT HgB A1C  4. CAD S/P percutaneous coronary angioplasty Being followed by cardiology  5. Cardiac murmur     Ethelle Ola Asad A[facility] 12/03/2015 8:18 AM

## 2015-12-04 LAB — COMPREHENSIVE METABOLIC PANEL
A/G RATIO: 2.1 (ref 1.1–2.5)
ALT: 13 IU/L (ref 0–44)
AST: 17 IU/L (ref 0–40)
Albumin: 4.4 g/dL (ref 3.5–4.8)
Alkaline Phosphatase: 65 IU/L (ref 39–117)
BUN/Creatinine Ratio: 14 (ref 10–22)
BUN: 22 mg/dL (ref 8–27)
Bilirubin Total: 0.5 mg/dL (ref 0.0–1.2)
CALCIUM: 9.1 mg/dL (ref 8.6–10.2)
CO2: 23 mmol/L (ref 18–29)
Chloride: 102 mmol/L (ref 96–106)
Creatinine, Ser: 1.56 mg/dL — ABNORMAL HIGH (ref 0.76–1.27)
GFR, EST AFRICAN AMERICAN: 51 mL/min/{1.73_m2} — AB (ref >59)
GFR, EST NON AFRICAN AMERICAN: 44 mL/min/{1.73_m2} — AB (ref >59)
Globulin, Total: 2.1 g/dL (ref 1.5–4.5)
Glucose: 103 mg/dL — ABNORMAL HIGH (ref 65–99)
POTASSIUM: 4 mmol/L (ref 3.5–5.2)
Sodium: 143 mmol/L (ref 134–144)
TOTAL PROTEIN: 6.5 g/dL (ref 6.0–8.5)

## 2015-12-04 LAB — LIPID PANEL
CHOL/HDL RATIO: 3.7 ratio (ref 0.0–5.0)
CHOLESTEROL TOTAL: 140 mg/dL (ref 100–199)
HDL: 38 mg/dL — ABNORMAL LOW (ref >39)
LDL Calculated: 68 mg/dL (ref 0–99)
TRIGLYCERIDES: 168 mg/dL — AB (ref 0–149)
VLDL Cholesterol Cal: 34 mg/dL (ref 5–40)

## 2015-12-16 ENCOUNTER — Ambulatory Visit (INDEPENDENT_AMBULATORY_CARE_PROVIDER_SITE_OTHER): Payer: Commercial Managed Care - HMO | Admitting: Family Medicine

## 2015-12-16 ENCOUNTER — Encounter: Payer: Self-pay | Admitting: Family Medicine

## 2015-12-16 VITALS — BP 116/63 | HR 70 | Temp 98.4°F | Resp 17 | Ht 66.0 in | Wt 181.1 lb

## 2015-12-16 DIAGNOSIS — R7989 Other specified abnormal findings of blood chemistry: Secondary | ICD-10-CM

## 2015-12-16 DIAGNOSIS — R748 Abnormal levels of other serum enzymes: Secondary | ICD-10-CM

## 2015-12-16 DIAGNOSIS — E785 Hyperlipidemia, unspecified: Secondary | ICD-10-CM

## 2015-12-16 DIAGNOSIS — E781 Pure hyperglyceridemia: Secondary | ICD-10-CM | POA: Diagnosis not present

## 2015-12-16 DIAGNOSIS — R42 Dizziness and giddiness: Secondary | ICD-10-CM | POA: Diagnosis not present

## 2015-12-16 NOTE — Progress Notes (Signed)
Name: Robert Hansen   MRN: [id number]    DOB: 17-Aug-1941   Date:12/16/2015       Progress Note  Subjective  Chief Complaint  Chief Complaint  Patient presents with  . Follow-up    change in statin therapy  . Hyperlipidemia  . Hypertension    Hyperlipidemia This is a chronic problem. The problem is controlled. Recent lipid tests were reviewed and are normal. Pertinent negatives include no chest pain, myalgias or shortness of breath. Current antihyperlipidemic treatment includes statins.  Hypertension This is a chronic problem. The problem is unchanged. The problem is controlled. Pertinent negatives include no chest pain, headaches, palpitations or shortness of breath. Past treatments include beta blockers, calcium channel blockers and direct vasodilators (Has cut down to 1 Hydralazine 100mg  daily and 1 Metoprolol 25 mg daily, and dizziness has resolved.). Hypertensive end-organ damage includes CAD/MI. There is no history of CVA.    Past Medical History  Diagnosis Date  . Hyperlipidemia   . Allergy   . Hypertension   . GERD (gastroesophageal reflux disease)   . Cancer ([place])   . Cholelithiasis     Past Surgical History  Procedure Laterality Date  . Colon surgery    . Coronary angioplasty with stent placement      Family History  Problem Relation Age of Onset  . Diabetes Mother   . Cancer Mother     breast    Social History   Social History  . Marital Status: Married    Spouse Name: N/A  . Number of Children: N/A  . Years of Education: N/A   Occupational History  . Not on file.   Social History Main Topics  . Smoking status: Never Smoker   . Smokeless tobacco: Never Used  . Alcohol Use: 0.0 oz/week    0 Standard drinks or equivalent per week     Comment: occasional  . Drug Use: No  . Sexual Activity: Not on file   Other Topics Concern  . Not on file   Social History Narrative     Current outpatient prescriptions:  .  amLODipine (NORVASC) 5 MG  tablet, Take 1 tablet by mouth daily., Disp: , Rfl:  .  aspirin 81 MG tablet, Take by mouth., Disp: , Rfl:  .  clopidogrel (PLAVIX) 75 MG tablet, Take 1 tablet by mouth daily., Disp: , Rfl:  .  hydrALAZINE (APRESOLINE) 100 MG tablet, Take 100 mg by mouth daily. Reported on 12/16/2015, Disp: , Rfl:  .  hydrochlorothiazide (HYDRODIURIL) 12.5 MG tablet, Take 1 tablet by mouth daily., Disp: , Rfl:  .  metoprolol tartrate (LOPRESSOR) 25 MG tablet, Take 25 mg by mouth daily. , Disp: , Rfl:  .  nitroGLYCERIN (NITROSTAT) 0.4 MG SL tablet, Place under the tongue., Disp: , Rfl:  .  Omega-3 Fatty Acids (FISH OIL) 1200 MG CPDR, Take 1 capsule by mouth 2 (two) times daily., Disp: , Rfl:  .  ondansetron (ZOFRAN ODT) 4 MG disintegrating tablet, Take 1 tablet (4 mg total) by mouth every 8 (eight) hours as needed for nausea or vomiting., Disp: 12 tablet, Rfl: 0 .  oxyCODONE (ROXICODONE) 5 MG immediate release tablet, Take 1 tablet (5 mg total) by mouth every 6 (six) hours as needed for moderate pain. Do not drive while taking this medication., Disp: 12 tablet, Rfl: 0 .  simvastatin (ZOCOR) 40 MG tablet, Take 1 tablet by mouth at bedtime., Disp: , Rfl:   Allergies  Allergen Reactions  . Codeine Nausea  And Vomiting  . Morphine Nausea Only     Review of Systems  Constitutional: Negative for fever and chills.  Respiratory: Negative for shortness of breath.   Cardiovascular: Negative for chest pain and palpitations.  Musculoskeletal: Negative for myalgias.  Neurological: Negative for dizziness and headaches.    Objective  Filed Vitals:   12/16/15 0915  BP: 116/63  Pulse: 70  Temp: 98.4 F (36.9 C)  TempSrc: Oral  Resp: 17  Height: 5\' 6"  (1.676 m)  Weight: 181 lb 1.6 oz (82.146 kg)  SpO2: 94%    Physical Exam  Constitutional: He is oriented to person, place, and time and well-developed, well-nourished, and in no distress.  HENT:  Head: Normocephalic and atraumatic.  Cardiovascular: Normal rate  and regular rhythm.   Pulmonary/Chest: Effort normal and breath sounds normal.  Neurological: He is alert and oriented to person, place, and time.  Nursing note and vitals reviewed.  Recent Results (from the past 2160 hour(s))  Lipid Profile     Status: Abnormal   Collection Time: 12/03/15  8:55 AM  Result Value Ref Range   Cholesterol, Total 140 100 - 199 mg/dL   Triglycerides 168 (H) 0 - 149 mg/dL   HDL 38 (L) >39 mg/dL   VLDL Cholesterol Cal 34 5 - 40 mg/dL   LDL Calculated 68 0 - 99 mg/dL   Chol/HDL Ratio 3.7 0.0 - 5.0 ratio units    Comment:                                   T. Chol/HDL Ratio                                             Men  Women                               1/2 Avg.Risk  3.4    3.3                                   Avg.Risk  5.0    4.4                                2X Avg.Risk  9.6    7.1                                3X Avg.Risk 23.4   11.0   Comprehensive Metabolic Panel (CMET)     Status: Abnormal   Collection Time: 12/03/15  8:55 AM  Result Value Ref Range   Glucose 103 (H) 65 - 99 mg/dL   BUN 22 8 - 27 mg/dL   Creatinine, Ser 1.56 (H) 0.76 - 1.27 mg/dL   GFR calc non Af Amer 44 (L) >59 mL/min/1.73   GFR calc Af Amer 51 (L) >59 mL/min/1.73   BUN/Creatinine Ratio 14 10 - 22   Sodium 143 134 - 144 mmol/L   Potassium 4.0 3.5 - 5.2 mmol/L   Chloride 102 96 - 106 mmol/L   CO2 23 18 - 29 mmol/L  Calcium 9.1 8.6 - 10.2 mg/dL   Total Protein 6.5 6.0 - 8.5 g/dL   Albumin 4.4 3.5 - 4.8 g/dL   Globulin, Total 2.1 1.5 - 4.5 g/dL   Albumin/Globulin Ratio 2.1 1.1 - 2.5   Bilirubin Total 0.5 0.0 - 1.2 mg/dL   Alkaline Phosphatase 65 39 - 117 IU/L   AST 17 0 - 40 IU/L   ALT 13 0 - 44 IU/L  POCT HgB A1C     Status: Normal   Collection Time: 12/03/15  9:01 AM  Result Value Ref Range   Hemoglobin A1C 6.0      Assessment & Plan  1. Hyperlipidemia FLP reviewed with patient. Advised to continue on simvastatin and Fish oil and follow-up with  cardiology.  2. Hypertriglyceridemia   3. Elevated serum creatinine   4. Dizziness Dizziness has resolved after he decreased hydralazine and metoprolol by half. Advised to consult cardiologist and follow-up.    Aleksa Collinsworth Asad A[facility] 12/16/2015 9:27 AM

## 2016-03-02 ENCOUNTER — Ambulatory Visit: Payer: Commercial Managed Care - HMO | Admitting: Family Medicine

## 2016-03-15 ENCOUNTER — Emergency Department: Payer: Commercial Managed Care - HMO

## 2016-03-15 ENCOUNTER — Encounter: Payer: Self-pay | Admitting: *Deleted

## 2016-03-15 ENCOUNTER — Observation Stay
Admission: EM | Admit: 2016-03-15 | Discharge: 2016-03-16 | Disposition: A | Discharge status: Home / Self Care | Payer: Commercial Managed Care - HMO | Attending: General Surgery | Admitting: General Surgery

## 2016-03-15 DIAGNOSIS — Z833 Family history of diabetes mellitus: Secondary | ICD-10-CM | POA: Insufficient documentation

## 2016-03-15 DIAGNOSIS — Z885 Allergy status to narcotic agent status: Secondary | ICD-10-CM | POA: Diagnosis not present

## 2016-03-15 DIAGNOSIS — Z803 Family history of malignant neoplasm of breast: Secondary | ICD-10-CM | POA: Insufficient documentation

## 2016-03-15 DIAGNOSIS — Z7982 Long term (current) use of aspirin: Secondary | ICD-10-CM | POA: Diagnosis not present

## 2016-03-15 DIAGNOSIS — I10 Essential (primary) hypertension: Secondary | ICD-10-CM | POA: Insufficient documentation

## 2016-03-15 DIAGNOSIS — Z9109 Other allergy status, other than to drugs and biological substances: Secondary | ICD-10-CM | POA: Insufficient documentation

## 2016-03-15 DIAGNOSIS — R1010 Upper abdominal pain, unspecified: Secondary | ICD-10-CM

## 2016-03-15 DIAGNOSIS — Z9889 Other specified postprocedural states: Secondary | ICD-10-CM | POA: Diagnosis not present

## 2016-03-15 DIAGNOSIS — K819 Cholecystitis, unspecified: Secondary | ICD-10-CM | POA: Diagnosis present

## 2016-03-15 DIAGNOSIS — K8020 Calculus of gallbladder without cholecystitis without obstruction: Principal | ICD-10-CM

## 2016-03-15 DIAGNOSIS — K219 Gastro-esophageal reflux disease without esophagitis: Secondary | ICD-10-CM | POA: Insufficient documentation

## 2016-03-15 DIAGNOSIS — Z7902 Long term (current) use of antithrombotics/antiplatelets: Secondary | ICD-10-CM | POA: Insufficient documentation

## 2016-03-15 DIAGNOSIS — E785 Hyperlipidemia, unspecified: Secondary | ICD-10-CM | POA: Insufficient documentation

## 2016-03-15 DIAGNOSIS — Z955 Presence of coronary angioplasty implant and graft: Secondary | ICD-10-CM | POA: Diagnosis not present

## 2016-03-15 DIAGNOSIS — R1011 Right upper quadrant pain: Secondary | ICD-10-CM | POA: Diagnosis not present

## 2016-03-15 LAB — URINALYSIS COMPLETE WITH MICROSCOPIC (ARMC ONLY)
BACTERIA UA: NONE SEEN
Bilirubin Urine: NEGATIVE
GLUCOSE, UA: NEGATIVE mg/dL
Hgb urine dipstick: NEGATIVE
Ketones, ur: NEGATIVE mg/dL
Leukocytes, UA: NEGATIVE
Nitrite: NEGATIVE
Protein, ur: 30 mg/dL — AB
RBC / HPF: NONE SEEN RBC/hpf (ref 0–5)
Specific Gravity, Urine: 1.002 — ABNORMAL LOW (ref 1.005–1.030)
Squamous Epithelial / LPF: NONE SEEN
pH: 6 (ref 5.0–8.0)

## 2016-03-15 LAB — COMPREHENSIVE METABOLIC PANEL
ALT: 17 U/L (ref 17–63)
ANION GAP: 7 (ref 5–15)
AST: 23 U/L (ref 15–41)
Albumin: 4.5 g/dL (ref 3.5–5.0)
Alkaline Phosphatase: 66 U/L (ref 38–126)
BILIRUBIN TOTAL: 0.4 mg/dL (ref 0.3–1.2)
BUN: 19 mg/dL (ref 6–20)
CO2: 27 mmol/L (ref 22–32)
Calcium: 9.2 mg/dL (ref 8.9–10.3)
Chloride: 104 mmol/L (ref 101–111)
Creatinine, Ser: 1.7 mg/dL — ABNORMAL HIGH (ref 0.61–1.24)
GFR calc Af Amer: 44 mL/min — ABNORMAL LOW (ref >60)
GFR, EST NON AFRICAN AMERICAN: 38 mL/min — AB (ref >60)
Glucose, Bld: 148 mg/dL — ABNORMAL HIGH (ref 65–99)
POTASSIUM: 3.1 mmol/L — AB (ref 3.5–5.1)
Sodium: 138 mmol/L (ref 135–145)
TOTAL PROTEIN: 7.7 g/dL (ref 6.5–8.1)

## 2016-03-15 LAB — CBC
HEMATOCRIT: 43.3 % (ref 40.0–52.0)
HEMOGLOBIN: 15 g/dL (ref 13.0–18.0)
MCH: 31.7 pg (ref 26.0–34.0)
MCHC: 34.6 g/dL (ref 32.0–36.0)
MCV: 91.7 fL (ref 80.0–100.0)
Platelets: 143 10*3/uL — ABNORMAL LOW (ref 150–440)
RBC: 4.72 MIL/uL (ref 4.40–5.90)
RDW: 13.5 % (ref 11.5–14.5)
WBC: 6.4 10*3/uL (ref 3.8–10.6)

## 2016-03-15 LAB — LIPASE, BLOOD: Lipase: 25 U/L (ref 11–51)

## 2016-03-15 LAB — TROPONIN I

## 2016-03-15 MED ORDER — AMLODIPINE BESYLATE 5 MG PO TABS
5.0000 mg | ORAL_TABLET | Freq: Every day | ORAL | Status: DC
  Administered 2016-03-15: 5 mg via ORAL
  Filled 2016-03-15 (×2): qty 1

## 2016-03-15 MED ORDER — ONDANSETRON HCL 4 MG/2ML IJ SOLN
4.0000 mg | Freq: Once | INTRAMUSCULAR | Status: AC
  Administered 2016-03-15: 4 mg via INTRAVENOUS
  Filled 2016-03-15: qty 2

## 2016-03-15 MED ORDER — PANTOPRAZOLE SODIUM 40 MG IV SOLR
40.0000 mg | Freq: Every day | INTRAVENOUS | Status: DC
  Administered 2016-03-15: 40 mg via INTRAVENOUS
  Filled 2016-03-15: qty 40

## 2016-03-15 MED ORDER — ONDANSETRON HCL 4 MG/2ML IJ SOLN: 4.0000 mg | Freq: Four times a day (QID) | INTRAMUSCULAR | Status: DC | PRN

## 2016-03-15 MED ORDER — METOPROLOL TARTRATE 25 MG PO TABS
25.0000 mg | ORAL_TABLET | Freq: Every day | ORAL | Status: DC
  Administered 2016-03-15: 25 mg via ORAL
  Filled 2016-03-15 (×2): qty 1

## 2016-03-15 MED ORDER — HYDROMORPHONE HCL 1 MG/ML IJ SOLN
0.5000 mg | Freq: Once | INTRAMUSCULAR | Status: AC
  Administered 2016-03-15: 0.5 mg via INTRAVENOUS

## 2016-03-15 MED ORDER — HYDRALAZINE HCL 20 MG/ML IJ SOLN: 10.0000 mg | INTRAMUSCULAR | Status: DC | PRN

## 2016-03-15 MED ORDER — HYDROMORPHONE HCL 1 MG/ML IJ SOLN
1.0000 mg | INTRAMUSCULAR | Status: DC | PRN
  Administered 2016-03-15: 1 mg via INTRAVENOUS
  Filled 2016-03-15: qty 1

## 2016-03-15 MED ORDER — HYDROMORPHONE HCL 1 MG/ML IJ SOLN
INTRAMUSCULAR | Status: AC
  Filled 2016-03-15: qty 1

## 2016-03-15 MED ORDER — DIPHENHYDRAMINE HCL 12.5 MG/5ML PO ELIX: 12.5000 mg | ORAL_SOLUTION | Freq: Four times a day (QID) | ORAL | Status: DC | PRN

## 2016-03-15 MED ORDER — ONDANSETRON 8 MG PO TBDP: 4.0000 mg | ORAL_TABLET | Freq: Four times a day (QID) | ORAL | Status: DC | PRN

## 2016-03-15 MED ORDER — DIPHENHYDRAMINE HCL 50 MG/ML IJ SOLN: 12.5000 mg | Freq: Four times a day (QID) | INTRAMUSCULAR | Status: DC | PRN

## 2016-03-15 MED ORDER — HYDROMORPHONE HCL 1 MG/ML IJ SOLN
0.5000 mg | Freq: Once | INTRAMUSCULAR | Status: AC
  Administered 2016-03-15: 0.5 mg via INTRAVENOUS
  Filled 2016-03-15: qty 1

## 2016-03-15 MED ORDER — PNEUMOCOCCAL VAC POLYVALENT 25 MCG/0.5ML IJ INJ: 0.5000 mL | INJECTION | INTRAMUSCULAR | Status: DC

## 2016-03-15 MED ORDER — DEXTROSE 5 % IV SOLN
2.0000 g | INTRAVENOUS | Status: DC
  Administered 2016-03-15: 2 g via INTRAVENOUS
  Filled 2016-03-15 (×2): qty 2

## 2016-03-15 MED ORDER — LACTATED RINGERS IV SOLN
INTRAVENOUS | Status: DC
  Administered 2016-03-15 – 2016-03-16 (×3): via INTRAVENOUS

## 2016-03-15 MED ORDER — HYDROCHLOROTHIAZIDE 25 MG PO TABS
12.5000 mg | ORAL_TABLET | Freq: Every day | ORAL | Status: DC
  Administered 2016-03-15: 12.5 mg via ORAL
  Filled 2016-03-15 (×2): qty 1

## 2016-03-15 NOTE — ED Notes (Signed)
Pt denies N/V/D. Pt states has a history of gallstones.

## 2016-03-15 NOTE — H&P (Signed)
Patient ID: Robert Hansen, male   DOB: [birth year], 75 y.o.   MRN: [id number]  CC: ABDOMINAL PAIN  Robert Hansen is a 75 y.o. male who presents emergency department for evaluation of right upper quadrant abdominal pain. Patient states the pain started approximately 3 AM this morning and wraps around from his midepigastrium to his right upper back. Patient states this is similar to his previous gallstone attacks however this is more severe and has not subsided. Patient denies any fevers, chills, nausea, vomiting, diarrhea, constipation. Patient states he was offered surgery for his gallbladder last fall but declined at that time. He is now interested in having his gallbladder removed today does not feel this pain anymore.  Robert  Past Medical History  Diagnosis Date  . Hyperlipidemia   . Allergy   . Hypertension   . GERD (gastroesophageal reflux disease)   . Cancer ([place])   . Cholelithiasis     Past Surgical History  Procedure Laterality Date  . Colon surgery    . Coronary angioplasty with stent placement      Family History  Problem Relation Age of Onset  . Diabetes Mother   . Cancer Mother     breast    Social History Social History  Substance Use Topics  . Smoking status: Never Smoker   . Smokeless tobacco: Never Used  . Alcohol Use: 0.0 oz/week    0 Standard drinks or equivalent per week     Comment: occasional    Allergies  Allergen Reactions  . Codeine Nausea And Vomiting  . Morphine Nausea Only    Current Facility-Administered Medications  Medication Dose Route Frequency Provider Last Rate Last Dose  . amLODipine (NORVASC) tablet 5 mg  5 mg Oral Daily Clayburn Pert, MD   5 mg at 03/15/16 1230  . cefTRIAXone (ROCEPHIN) 2 g in dextrose 5 % 50 mL IVPB  2 g Intravenous Q24H Clayburn Pert, MD      . diphenhydrAMINE (BENADRYL) 12.5 MG/5ML elixir 12.5 mg  12.5 mg Oral Q6H PRN Clayburn Pert, MD       Or  . diphenhydrAMINE (BENADRYL) injection 12.5 mg  12.5  mg Intravenous Q6H PRN Clayburn Pert, MD      . hydrALAZINE (APRESOLINE) injection 10 mg  10 mg Intravenous Q2H PRN Clayburn Pert, MD      . hydrochlorothiazide (HYDRODIURIL) tablet 12.5 mg  12.5 mg Oral Daily Clayburn Pert, MD   12.5 mg at 03/15/16 1230  . HYDROmorphone (DILAUDID) injection 1 mg  1 mg Intravenous Q4H PRN Clayburn Pert, MD      . lactated ringers infusion   Intravenous Continuous Clayburn Pert, MD 125 mL/hr at 03/15/16 1225    . metoprolol tartrate (LOPRESSOR) tablet 25 mg  25 mg Oral Daily Clayburn Pert, MD   25 mg at 03/15/16 1231  . ondansetron (ZOFRAN-ODT) disintegrating tablet 4 mg  4 mg Oral Q6H PRN Clayburn Pert, MD       Or  . ondansetron [facility]) injection 4 mg  4 mg Intravenous Q6H PRN Clayburn Pert, MD      . pantoprazole (PROTONIX) injection 40 mg  40 mg Intravenous QHS Clayburn Pert, MD         Review of Systems A Multi-point review of systems was asked and was negative except for the findings documented in the history of present illness  Physical Exam Blood pressure 178/77, pulse 50, temperature 97.7 F (36.5 C), temperature source Oral, resp. rate 14, height 5\' 8"  (  1.727 m), weight 82.101 kg (181 lb), SpO2 96 %. CONSTITUTIONAL: No acute distress. EYES: Pupils are equal, round, and reactive to light, Sclera are non-icteric. EARS, NOSE, MOUTH AND THROAT: The oropharynx is clear. The oral mucosa is pink and moist. Hearing is intact to voice. LYMPH NODES:  Lymph nodes in the neck are normal. RESPIRATORY:  Lungs are clear. There is normal respiratory effort, with equal breath sounds bilaterally, and without pathologic use of accessory muscles. CARDIOVASCULAR: Heart is regular without murmurs, gallops, or rubs. GI: The abdomen is soft, tender to palpation in the right upper quadrant with a positive Murphy sign, and nondistended. There are no palpable masses. There is no hepatosplenomegaly. There are normal bowel sounds in all quadrants. Well-healed  lower midline incision from prior surgery. GU: Rectal deferred.   MUSCULOSKELETAL: Normal muscle strength and tone. No cyanosis or edema.   SKIN: Turgor is good and there are no pathologic skin lesions or ulcers. NEUROLOGIC: Motor and sensation is grossly normal. Cranial nerves are grossly intact. PSYCH:  Oriented to person, place and time. Affect is normal.  Data Reviewed Images and labs reviewed. 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 All of his LFTs are within normal limits. He does have a mildly elevated creatinine of 1.7 as well as hypokalemia at 3.1. Right upper quadrant ultrasound shows gallstones but no gallbladder wall thickening, pericholecystic fluid, ductal dilatation. I have personally reviewed the patient's imaging, laboratory findings and medical records.    Assessment    Cholecystitis    Plan    75 year old male with a history and physical consistent with cholecystitis in spite of normal labs and ultrasound. Discussed with the patient the diagnosis of cholecystitis and the recommended treatment to include a cholecystectomy. Plan to bring the patient and under observation to see if his symptoms resolve. Treatment plan is mildly complicated by the patient's recent use of aspirin and Plavix which may delay any operative intervention. Possible OR later this week if fails to improve.     Time spent with the patient was 55 minutes, with more than 50% of the time spent in face-to-face education, counseling and care coordination.     Clayburn Pert, MD FACS General Surgeon 03/15/2016, 2:55 PM

## 2016-03-15 NOTE — ED Notes (Signed)
Pt presents w/ c/o RUQ abdominal pain x 3 hrs. Pt denies n/v/d, fever, hematuria and constipation. Pt has hx of gallbladder problems.

## 2016-03-15 NOTE — Plan of Care (Signed)
Problem: Safety: Goal: Ability to remain free from injury will improve Outcome: Progressing Pt was oriented to safety plan. No call bell needed, pt is a low fall risk. Pt is wearing brown socks.

## 2016-03-15 NOTE — ED Provider Notes (Signed)
[facility] Emergency Department Provider Note  ____________________________________________    I have reviewed the triage vital signs and the nursing notes.   HISTORY  Chief Complaint Abdominal Pain    HPI Robert Hansen is a 75 y.o. male who presents with complaints of right upper quadrant abdominal pain. Patient reports the pain woke him up this morning and has been constant since then. He reports the pain travels to his right shoulder blade. He does have a history of gallbladder "problems". He denies nausea or vomiting. No fevers or chills. He reports this pain feels similar to prior gallbladder attack. Normal stools. Does have a history of a partial colectomy due to colon cancer.     Past Medical History  Diagnosis Date  . Hyperlipidemia   . Allergy   . Hypertension   . GERD (gastroesophageal reflux disease)   . Cancer ([place])   . Cholelithiasis     Patient Active Problem List   Diagnosis Date Noted  . Hypertriglyceridemia 12/16/2015  . Elevated serum creatinine 12/16/2015  . Dizziness 12/16/2015  . Hyperglycemia 12/03/2015  . Biliary colic [id number]  . Hypertension 07/15/2015  . Hyperlipidemia 07/15/2015  . Cardiac murmur 07/15/2015  . CAD S/P percutaneous coronary angioplasty 07/15/2015    Past Surgical History  Procedure Laterality Date  . Colon surgery    . Coronary angioplasty with stent placement      Current Outpatient Rx  Name  Route  Sig  Dispense  Refill  . amLODipine (NORVASC) 5 MG tablet   Oral   Take 1 tablet by mouth daily.         Marland Kitchen aspirin 81 MG tablet   Oral   Take by mouth.         . clopidogrel (PLAVIX) 75 MG tablet   Oral   Take 1 tablet by mouth daily.         . hydrALAZINE (APRESOLINE) 100 MG tablet   Oral   Take 100 mg by mouth daily. Reported on 12/16/2015         . hydrochlorothiazide (HYDRODIURIL) 12.5 MG tablet   Oral   Take 1 tablet by mouth daily.         . metoprolol tartrate  (LOPRESSOR) 25 MG tablet   Oral   Take 25 mg by mouth daily.          . nitroGLYCERIN (NITROSTAT) 0.4 MG SL tablet   Sublingual   Place under the tongue.         . Omega-3 Fatty Acids (FISH OIL) 1200 MG CPDR   Oral   Take 1 capsule by mouth 2 (two) times daily.         . ondansetron (ZOFRAN ODT) 4 MG disintegrating tablet   Oral   Take 1 tablet (4 mg total) by mouth every 8 (eight) hours as needed for nausea or vomiting.   12 tablet   0   . oxyCODONE (ROXICODONE) 5 MG immediate release tablet   Oral   Take 1 tablet (5 mg total) by mouth every 6 (six) hours as needed for moderate pain. Do not drive while taking this medication.   12 tablet   0   . simvastatin (ZOCOR) 40 MG tablet   Oral   Take 1 tablet by mouth at bedtime.           Allergies Codeine and Morphine  Family History  Problem Relation Age of Onset  . Diabetes Mother   . Cancer Mother  breast    Social History Social History  Substance Use Topics  . Smoking status: Never Smoker   . Smokeless tobacco: Never Used  . Alcohol Use: 0.0 oz/week    0 Standard drinks or equivalent per week     Comment: occasional    Review of Systems  Constitutional: Negative for fever. Eyes: Negative for redness ENT: Negative for sore throat Cardiovascular: Negative for chest pain Respiratory: Negative for shortness of breath. Gastrointestinal: Abdominal pain as above Genitourinary: Negative for dysuria. Musculoskeletal: Negative for back pain. Skin: Negative for rash. Neurological: Negative for headache Psychiatric: no anxiety    ____________________________________________   PHYSICAL EXAM:  VITAL SIGNS: ED Triage Vitals  Enc Vitals Group     BP 03/15/16 0658 174/9 mmHg     Pulse Rate 03/15/16 0658 51     Resp 03/15/16 0658 20     Temp 03/15/16 0658 97.9 F (36.6 C)     Temp Source 03/15/16 0658 Oral     SpO2 03/15/16 0658 99 %     Weight 03/15/16 0658 181 lb (82.101 kg)     Height  03/15/16 0658 5\' 8"  (1.727 m)     Head Cir --      Peak Flow --      Pain Score 03/15/16 0659 10     Pain Loc --      Pain Edu? --      Excl. in [place] and oriented, uncomfortable-appearing. Pleasant and interactive Eyes: Conjunctivae are normal. No erythema or injection ENT   Head: Normocephalic and atraumatic.   Mouth/Throat: Mucous membranes are moist. Cardiovascular: Normal rate, regular rhythm. Normal and symmetric distal pulses are present in the upper extremities. Respiratory: Normal respiratory effort without tachypnea nor retractions. Breath sounds are clear and equal bilaterally.  Gastrointestinal: Significant tenderness palpation right upper quadrant. No distention. There is no CVA tenderness. Genitourinary: deferred Musculoskeletal: Nontender with normal range of motion in all extremities.  Neurologic:  Normal speech and language. No gross focal neurologic deficits are appreciated. Skin:  Skin is warm, dry and intact. No rash noted. Psychiatric: Mood and affect are normal. Patient exhibits appropriate insight and judgment.  ____________________________________________    LABS (pertinent positives/negatives)  Labs Reviewed  CBC - Abnormal; Notable for the following:    Platelets 143 (*)    All other components within normal limits  LIPASE, BLOOD  COMPREHENSIVE METABOLIC PANEL  URINALYSIS COMPLETEWITH MICROSCOPIC ([facility] ONLY)  TROPONIN I    ____________________________________________   EKG  ED ECG REPORT I, Lavonia Drafts, the attending physician, personally viewed and interpreted this ECG.  Date: 03/15/2016 EKG Time: 7:30 AM Rate: 47 Rhythm: Sinus bradycardia QRS Axis: normal Intervals: normal ST/T Wave abnormalities: Nonspecific Conduction Disturbances: none   ____________________________________________    RADIOLOGY  Ultrasound right upper  quadrant  ____________________________________________   PROCEDURES  Procedure(s) performed: none  Critical Care performed: none  ____________________________________________   INITIAL IMPRESSION / ASSESSMENT AND PLAN / ED COURSE  Pertinent labs & imaging results that were available during my care of the patient were reviewed by me and considered in my medical decision making (see chart for details).  Patient presents with right upper quadrant abdominal pain. Significant tenderness in the area suspicious for cholecystitis. We will check labs, give Dilaudid IV, Zofran IV, check ultrasound and reevaluate.  Dr. Adonis Huguenin has evaluated the patient and will admit given that he has required multiple doses of pain medications ____________________________________________   FINAL CLINICAL IMPRESSION(S) /  ED DIAGNOSES  Final diagnoses:  Pain of upper abdomen  Calculus of gallbladder without cholecystitis without obstruction          Lavonia Drafts, MD 03/15/16 1101

## 2016-03-16 ENCOUNTER — Telehealth: Payer: Self-pay | Admitting: Family Medicine

## 2016-03-16 DIAGNOSIS — K8020 Calculus of gallbladder without cholecystitis without obstruction: Secondary | ICD-10-CM

## 2016-03-16 DIAGNOSIS — K819 Cholecystitis, unspecified: Secondary | ICD-10-CM | POA: Diagnosis not present

## 2016-03-16 LAB — APTT: APTT: 31 s (ref 24–36)

## 2016-03-16 LAB — PROTIME-INR
INR: 1.16
Prothrombin Time: 15 seconds (ref 11.4–15.0)

## 2016-03-16 LAB — COMPREHENSIVE METABOLIC PANEL
ALT: 16 U/L — ABNORMAL LOW (ref 17–63)
AST: 20 U/L (ref 15–41)
Albumin: 3.4 g/dL — ABNORMAL LOW (ref 3.5–5.0)
Alkaline Phosphatase: 48 U/L (ref 38–126)
Anion gap: 3 — ABNORMAL LOW (ref 5–15)
BILIRUBIN TOTAL: 0.8 mg/dL (ref 0.3–1.2)
BUN: 15 mg/dL (ref 6–20)
CHLORIDE: 105 mmol/L (ref 101–111)
CO2: 28 mmol/L (ref 22–32)
CREATININE: 1.56 mg/dL — AB (ref 0.61–1.24)
Calcium: 8.2 mg/dL — ABNORMAL LOW (ref 8.9–10.3)
GFR, EST AFRICAN AMERICAN: 49 mL/min — AB (ref >60)
GFR, EST NON AFRICAN AMERICAN: 42 mL/min — AB (ref >60)
Glucose, Bld: 102 mg/dL — ABNORMAL HIGH (ref 65–99)
POTASSIUM: 3.5 mmol/L (ref 3.5–5.1)
Sodium: 136 mmol/L (ref 135–145)
TOTAL PROTEIN: 6 g/dL — AB (ref 6.5–8.1)

## 2016-03-16 LAB — PHOSPHORUS: PHOSPHORUS: 2.5 mg/dL (ref 2.5–4.6)

## 2016-03-16 LAB — CBC
HCT: 37.9 % — ABNORMAL LOW (ref 40.0–52.0)
HEMOGLOBIN: 12.8 g/dL — AB (ref 13.0–18.0)
MCH: 31.3 pg (ref 26.0–34.0)
MCHC: 33.9 g/dL (ref 32.0–36.0)
MCV: 92.4 fL (ref 80.0–100.0)
PLATELETS: 119 10*3/uL — AB (ref 150–440)
RBC: 4.1 MIL/uL — ABNORMAL LOW (ref 4.40–5.90)
RDW: 13.7 % (ref 11.5–14.5)
WBC: 5.7 10*3/uL (ref 3.8–10.6)

## 2016-03-16 LAB — MAGNESIUM: MAGNESIUM: 1.8 mg/dL (ref 1.7–2.4)

## 2016-03-16 MED ORDER — OXYCODONE-ACETAMINOPHEN 5-325 MG PO TABS: 1.0000 | ORAL_TABLET | Freq: Four times a day (QID) | ORAL | Status: DC | PRN

## 2016-03-16 NOTE — Progress Notes (Signed)
Pt A and O x 4. VSS. Pt tolerating diet well. No complaints of pain or nausea. IV removed intact, prescriptions given. Pt voiced understanding of discharge instructions with no further questions. Pt discharged via wheelchair with axillary.   

## 2016-03-16 NOTE — Discharge Instructions (Signed)
Biliary Colic °Biliary colic is a pain in the upper abdomen. The pain: °· Is usually felt on the right side of the abdomen, but it may also be felt in the center of the abdomen, just below the breastbone (sternum). °· May spread back toward the right shoulder blade. °· May be steady or irregular. °· May be accompanied by nausea and vomiting. °Most of the time, the pain goes away in 1-5 hours. After the most intense pain passes, the abdomen may continue to ache mildly for about 24 hours. °Biliary colic is caused by a blockage in the bile duct. The bile duct is a pathway that carries bile--a liquid that helps to digest fats--from the gallbladder to the small intestine. Biliary colic usually occurs after eating, when the digestive system demands bile. The pain develops when muscle cells contract forcefully to try to move the blockage so that bile can get by. °HOME CARE INSTRUCTIONS °· Take medicines only as directed by your health care provider. °· Drink enough fluid to keep your urine clear or pale yellow. °· Avoid fatty, greasy, and fried foods. These kinds of foods increase your body's demand for bile. °· Avoid any foods that make your pain worse. °· Avoid overeating. °· Avoid having a large meal after fasting. °SEEK MEDICAL CARE IF: °· You develop a fever. °· Your pain gets worse. °· You vomit. °· You develop nausea that prevents you from eating and drinking. °SEEK IMMEDIATE MEDICAL CARE IF: °· You suddenly develop a fever and shaking chills. °· You develop a yellowish discoloration (jaundice) of: °¨ Skin. °¨ Whites of the eyes. °¨ Mucous membranes. °· You have continuous or severe pain that is not relieved with medicines. °· You have nausea and vomiting that is not relieved with medicines. °· You develop dizziness or you faint. °  °This information is not intended to replace advice given to you by your health care provider. Make sure you discuss any questions you have with your health care provider. °  °Document  Released: 04/18/2006 Document Revised: 04/01/2015 Document Reviewed: 08/27/2014 °Elsevier Interactive Patient Education ©2016 Elsevier Inc. ° °

## 2016-03-16 NOTE — Telephone Encounter (Signed)
Patient was seen at the ER on Monday morning and they scheduled him an appointment for 04-05-16 @ 9:00 am with [facility]. Please send referral

## 2016-03-17 DIAGNOSIS — K8020 Calculus of gallbladder without cholecystitis without obstruction: Secondary | ICD-10-CM | POA: Insufficient documentation

## 2016-03-17 NOTE — Discharge Summary (Signed)
Patient ID: Robert Hansen MRN: [id number] DOB/AGE: [birth year] 75 y.o.  Admit date: 03/15/2016 Discharge date: 03/16/2016  Discharge Diagnoses:  Cholecystitis  Procedures Performed: None  Discharged Condition: good  Hospital Course: Patient admitted for cholecystitis. His symptoms resolved with conservative therapy without surgery. Given his usage of aspirin and Plavix he'll be scheduled as an outpatient for surgical intervention once he is off of blood thinners.  Discharge Orders: Discharge Instructions    Diet - low sodium heart healthy    Complete by:  As directed      Increase activity slowly    Complete by:  As directed            Disposition: 01-Home or Self Care  Discharge Medications:   Medication List    STOP taking these medications        clopidogrel 75 MG tablet  Commonly known as:  PLAVIX      TAKE these medications        amLODipine 5 MG tablet  Commonly known as:  NORVASC  Take 1 tablet by mouth daily.     aspirin 81 MG tablet  Take 81 mg by mouth at bedtime.     Fish Oil 1200 MG Cpdr  Take 1 capsule by mouth 2 (two) times daily.     hydrALAZINE 100 MG tablet  Commonly known as:  APRESOLINE  Take 100 mg by mouth daily. Reported on 12/16/2015     hydrochlorothiazide 12.5 MG tablet  Commonly known as:  HYDRODIURIL  Take 1 tablet by mouth daily.     metoprolol tartrate 25 MG tablet  Commonly known as:  LOPRESSOR  Take 25 mg by mouth daily.     NITROSTAT 0.4 MG SL tablet  Generic drug:  nitroGLYCERIN  Place under the tongue.     oxyCODONE-acetaminophen 5-325 MG tablet  Commonly known as:  ROXICET  Take 1-2 tablets by mouth every 6 (six) hours as needed for severe pain.     simvastatin 40 MG tablet  Commonly known as:  ZOCOR  Take 1 tablet by mouth at bedtime.         Follwup: Follow-up Information    Follow up with Clayburn Pert. Go on 03/24/2016.   Why:  Wednesday at 10:45am for hospital follow-up   Contact information:   [id number], [place] [address] [phone]      Signed: Clayburn Pert 03/17/2016, 6:45 PM

## 2016-03-17 NOTE — Final Progress Note (Signed)
CC: Abdominal pain Subjective: Abdominal pain completely resolved  Objective: Vital signs in last 24 hours:   Last BM Date: 03/15/16  Intake/Output from previous day: 04/18 0701 - 04/19 0700 In: 836 [P.O.:660; I.V.:176] Out: 500 [Urine:500] Intake/Output this shift: Total I/O In: 1938 [P.O.:780; I.V.:1158] Out: 1362 [Urine:1362]  Physical exam:  Abdomen completely benign  Lab Results: CBC   Recent Labs  03/15/16 0741 03/16/16 0355  WBC 6.4 5.7  HGB 15.0 12.8*  HCT 43.3 37.9*  PLT 143* 119*   BMET  Recent Labs  03/15/16 0741 03/16/16 0355  NA 138 136  K 3.1* 3.5  CL 104 105  CO2 27 28  GLUCOSE 148* 102*  BUN 19 15  CREATININE 1.70* 1.56*  CALCIUM 9.2 8.2*   PT/INR  Recent Labs  03/16/16 0355  LABPROT 15.0  INR 1.16   ABG No results for input(s): PHART, HCO3 in the last 72 hours.  Invalid input(s): PCO2, PO2  Studies/Results: No results found.  Anti-infectives: Anti-infectives    Start     Dose/Rate Route Frequency Ordered Stop   03/15/16 1230  cefTRIAXone (ROCEPHIN) 2 g in dextrose 5 % 50 mL IVPB  Status:  Discontinued     2 g 100 mL/hr over 30 Minutes Intravenous Every 24 hours 03/15/16 1208 03/16/16 1357      Assessment/Plan:  75 year old male admitted for cholecystitis. All the symptoms completely resolved with IV antibiotics. Able to tolerate a diet and be discharged home without any difficulty. He'll follow up in clinic to schedule an outpatient cholecystectomy when he is off of his blood thinners.  Ellia Knowlton T. Adonis Huguenin, MD, FACS  03/17/2016

## 2016-03-17 NOTE — Telephone Encounter (Signed)
Routed to Dr. Shah for referral 

## 2016-03-24 ENCOUNTER — Ambulatory Visit: Payer: Commercial Managed Care - HMO | Admitting: General Surgery

## 2016-03-31 ENCOUNTER — Other Ambulatory Visit: Payer: Self-pay

## 2016-03-31 DIAGNOSIS — E782 Mixed hyperlipidemia: Secondary | ICD-10-CM | POA: Insufficient documentation

## 2016-03-31 DIAGNOSIS — I2510 Atherosclerotic heart disease of native coronary artery without angina pectoris: Secondary | ICD-10-CM | POA: Insufficient documentation

## 2016-03-31 DIAGNOSIS — K219 Gastro-esophageal reflux disease without esophagitis: Secondary | ICD-10-CM | POA: Insufficient documentation

## 2016-03-31 DIAGNOSIS — I252 Old myocardial infarction: Secondary | ICD-10-CM | POA: Insufficient documentation

## 2016-03-31 DIAGNOSIS — Z85038 Personal history of other malignant neoplasm of large intestine: Secondary | ICD-10-CM | POA: Insufficient documentation

## 2016-03-31 DIAGNOSIS — C189 Malignant neoplasm of colon, unspecified: Secondary | ICD-10-CM | POA: Insufficient documentation

## 2016-03-31 DIAGNOSIS — Z709 Sex counseling, unspecified: Secondary | ICD-10-CM | POA: Insufficient documentation

## 2016-03-31 DIAGNOSIS — N189 Chronic kidney disease, unspecified: Secondary | ICD-10-CM | POA: Insufficient documentation

## 2016-03-31 DIAGNOSIS — I10 Essential (primary) hypertension: Secondary | ICD-10-CM | POA: Insufficient documentation

## 2016-04-01 ENCOUNTER — Ambulatory Visit (INDEPENDENT_AMBULATORY_CARE_PROVIDER_SITE_OTHER): Payer: Commercial Managed Care - HMO | Admitting: Family Medicine

## 2016-04-01 ENCOUNTER — Encounter: Payer: Self-pay | Admitting: Family Medicine

## 2016-04-01 VITALS — BP 124/70 | HR 59 | Temp 98.0°F | Resp 18 | Ht 68.0 in | Wt 179.0 lb

## 2016-04-01 DIAGNOSIS — K819 Cholecystitis, unspecified: Secondary | ICD-10-CM

## 2016-04-01 NOTE — Progress Notes (Signed)
Name: Robert Hansen   MRN: [id number]    DOB: 1941/09/20   Date:04/01/2016       Progress Note  Subjective  Chief Complaint  Chief Complaint  Patient presents with  . Hypertension    pt here for 4 month follow up  . Hyperlipidemia  . Referral    to [facility] for gallbladder removal    HPI  Surgical Referral:  Pt. Presents to obtain referral to Dr. Clayburn Pert at [facility] for consideration of Cholecystectomy. He was admitted for Cholecystitis in April 2017 and was discharged for outpatient surgical follow up. He has an appointment on May 8th.   Past Medical History  Diagnosis Date  . Hyperlipidemia   . Allergy   . Hypertension   . GERD (gastroesophageal reflux disease)   . Cancer ([place])   . Cholelithiasis     Past Surgical History  Procedure Laterality Date  . Colon surgery    . Coronary angioplasty with stent placement      Family History  Problem Relation Age of Onset  . Diabetes Mother   . Cancer Mother     breast    Social History   Social History  . Marital Status: Married    Spouse Name: N/A  . Number of Children: N/A  . Years of Education: N/A   Occupational History  . Not on file.   Social History Main Topics  . Smoking status: Never Smoker   . Smokeless tobacco: Never Used  . Alcohol Use: 0.0 oz/week    0 Standard drinks or equivalent per week     Comment: occasional  . Drug Use: No  . Sexual Activity: Not on file   Other Topics Concern  . Not on file   Social History Narrative     Current outpatient prescriptions:  .  amLODipine (NORVASC) 5 MG tablet, Take 1 tablet by mouth daily., Disp: , Rfl:  .  aspirin 81 MG tablet, Take 81 mg by mouth at bedtime. , Disp: , Rfl:  .  clopidogrel (PLAVIX) 75 MG tablet, Take 1 tablet by mouth daily. Reported on 04/01/2016, Disp: , Rfl:  .  hydrALAZINE (APRESOLINE) 100 MG tablet, Take 100 mg by mouth daily. Reported on 12/16/2015, Disp: , Rfl:  .  hydrochlorothiazide (HYDRODIURIL) 12.5 MG tablet,  Take 1 tablet by mouth daily., Disp: , Rfl:  .  metoprolol tartrate (LOPRESSOR) 25 MG tablet, Take 25 mg by mouth daily. , Disp: , Rfl:  .  nitroGLYCERIN (NITROSTAT) 0.4 MG SL tablet, Place under the tongue., Disp: , Rfl:  .  Omega-3 Fatty Acids (FISH OIL) 1200 MG CPDR, Take 1 capsule by mouth 2 (two) times daily., Disp: , Rfl:  .  oxyCODONE-acetaminophen (ROXICET) 5-325 MG tablet, Take 1-2 tablets by mouth every 6 (six) hours as needed for severe pain., Disp: 30 tablet, Rfl: 0 .  simvastatin (ZOCOR) 40 MG tablet, Take 1 tablet by mouth at bedtime., Disp: , Rfl:   Allergies  Allergen Reactions  . Codeine Nausea And Vomiting  . Morphine Nausea Only     Review of Systems  Constitutional: Negative for fever and chills.  Gastrointestinal: Negative for nausea, vomiting and abdominal pain.      Objective  Filed Vitals:   04/01/16 0938  BP: 124/70  Pulse: 59  Temp: 98 F (36.7 C)  Resp: 18  Height: 5\' 8"  (1.727 m)  Weight: 179 lb (81.194 kg)  SpO2: 96%    Physical Exam  Constitutional: He is oriented  to person, place, and time and well-developed, well-nourished, and in no distress.  Cardiovascular: Normal rate and regular rhythm.   Pulmonary/Chest: Effort normal and breath sounds normal.  Abdominal: Normal appearance. There is tenderness in the right upper quadrant.  Neurological: He is alert and oriented to person, place, and time.  Nursing note and vitals reviewed.    Assessment & Plan  1. Cholecystitis Referral to General Surgery authorized. - Ambulatory referral to General Surgery   Malorie Bigford [facility] 04/01/2016 9:51 AM

## 2016-04-05 ENCOUNTER — Other Ambulatory Visit: Payer: Self-pay

## 2016-04-05 ENCOUNTER — Ambulatory Visit (INDEPENDENT_AMBULATORY_CARE_PROVIDER_SITE_OTHER): Payer: Commercial Managed Care - HMO | Admitting: General Surgery

## 2016-04-05 ENCOUNTER — Telehealth: Payer: Self-pay | Admitting: General Surgery

## 2016-04-05 ENCOUNTER — Encounter: Payer: Self-pay | Admitting: General Surgery

## 2016-04-05 VITALS — BP 149/85 | HR 57 | Temp 97.9°F | Ht 66.0 in | Wt 180.5 lb

## 2016-04-05 DIAGNOSIS — K819 Cholecystitis, unspecified: Secondary | ICD-10-CM

## 2016-04-05 NOTE — Telephone Encounter (Signed)
Pt advised of pre op date/time and sx date. Sx: 04/19/16 with Dr Gabrielle Dare cholecystectomy.  Pre op: 04/09/16 @ 9:00am--Office.   Patient made aware to call [phone], between 1-3:00pm the day before surgery, to find out what time to arrive.

## 2016-04-05 NOTE — Patient Instructions (Signed)
Please call our office with any questions or concerns. Blue sheet was given to patient.

## 2016-04-05 NOTE — Progress Notes (Signed)
Outpatient Surgical Follow Up  04/05/2016  Robert Hansen is an 75 y.o. male.   Chief Complaint  Patient presents with  . Follow-up    cholecystitis    HPI: 75 year old male returns to clinic for follow up of cholecystitis. He was admitted to the hospital for this two weeks ago and treated with antibiotics which allowed for complete resolution of his symptoms. Patient reports since surgery that he's not had any nausea, vomiting, chest pain, shortness of breath, diarrhea, constipation. He continues to have some tenderness over the area of his gallbladder but it is much improved from his hospital patient. He has been abiding by a low-fat diet which has helped alleviate his symptoms. He was on aspirin and Plavix and stopped it after his admission and desires now to have his gallbladder removed. He is otherwise in his usual state of health.  Past Medical History  Diagnosis Date  . Hyperlipidemia   . Allergy   . Hypertension   . GERD (gastroesophageal reflux disease)   . Cancer ([place])   . Cholelithiasis     Past Surgical History  Procedure Laterality Date  . Colon surgery    . Coronary angioplasty with stent placement      Family History  Problem Relation Age of Onset  . Diabetes Mother   . Cancer Mother     breast    Social History:  reports that he has never smoked. He has never used smokeless tobacco. He reports that he drinks alcohol. He reports that he does not use illicit drugs.  Allergies:  Allergies  Allergen Reactions  . Codeine Nausea And Vomiting  . Morphine Nausea Only    Medications reviewed.    ROS A multipoint review of systems was completed. All pertinent positives and negatives are documented within the history of present illness and remainder are negative.   BP 149/85 mmHg  Pulse 57  Temp(Src) 97.9 F (36.6 C) (Oral)  Ht 5\' 6"  (1.676 m)  Wt 81.874 kg (180 lb 8 oz)  BMI 29.15 kg/m2  Physical Exam Gen.: No acute distress Neck: Supple and  nontender Lymph nodes: No evidence of cervical or clavicular lymphadenopathy Chest: Clear to auscultation with symmetric chest wall motion Heart: Regular rate but with an audible systolic murmur Abdomen: Soft, minimally tender to deep palpation over the right upper quadrant, nondistended. Negative Murphy's, guarding, peritoneal signs. Extremities: Moves ostomy is well Neuro: Cranial nerves grossly intact Psych: Alert and oriented 3    No results found for this or any previous visit (from the past 48 hour(s)). No results found.  Assessment/Plan:  1. Cholecystitis 75 year old male with a recent hospitalization for acute cholecystitis and was treated with antibiotics. He has done well since then. I discussed the procedure of laparoscopic cholecystectomy in detail.  The patient was given Neurosurgeon.  We discussed the risks and benefits of a laparoscopic cholecystectomy and possible cholangiogram including, but not limited to bleeding, infection, injury to surrounding structures such as the intestine or liver, bile leak, retained gallstones, need to convert to an open procedure, prolonged diarrhea, blood clots such as  DVT, common bile duct injury, anesthesia risks, and possible need for additional procedures.  The likelihood of improvement in symptoms and return to the patient's normal status is good. We discussed the typical post-operative recovery course.  Patient voiced understanding and wishes to proceed. We will plan to proceed on Monday, May 22.      Clayburn Pert, MD [facility] General Surgeon  04/05/2016,9:13 AM

## 2016-04-09 ENCOUNTER — Encounter
Admission: RE | Admit: 2016-04-09 | Discharge: 2016-04-09 | Disposition: A | Discharge status: Home / Self Care | Payer: Commercial Managed Care - HMO | Source: Ambulatory Visit | Attending: General Surgery | Admitting: General Surgery

## 2016-04-09 ENCOUNTER — Telehealth: Payer: Self-pay | Admitting: General Surgery

## 2016-04-09 ENCOUNTER — Ambulatory Visit
Admission: RE | Admit: 2016-04-09 | Discharge: 2016-04-09 | Disposition: A | Discharge status: Home / Self Care | Payer: Commercial Managed Care - HMO | Source: Ambulatory Visit | Attending: General Surgery | Admitting: General Surgery

## 2016-04-09 DIAGNOSIS — Z01810 Encounter for preprocedural cardiovascular examination: Secondary | ICD-10-CM

## 2016-04-09 DIAGNOSIS — Z01812 Encounter for preprocedural laboratory examination: Secondary | ICD-10-CM | POA: Insufficient documentation

## 2016-04-09 DIAGNOSIS — Z01818 Encounter for other preprocedural examination: Secondary | ICD-10-CM | POA: Diagnosis not present

## 2016-04-09 DIAGNOSIS — K8020 Calculus of gallbladder without cholecystitis without obstruction: Secondary | ICD-10-CM | POA: Diagnosis not present

## 2016-04-09 HISTORY — DX: Unspecified osteoarthritis, unspecified site: M19.90

## 2016-04-09 HISTORY — DX: Atherosclerotic heart disease of native coronary artery without angina pectoris: I25.10

## 2016-04-09 HISTORY — DX: Type 2 diabetes mellitus without complications: E11.9

## 2016-04-09 HISTORY — DX: Cardiac murmur, unspecified: R01.1

## 2016-04-09 LAB — BASIC METABOLIC PANEL
Anion gap: 8 (ref 5–15)
BUN: 22 mg/dL — ABNORMAL HIGH (ref 6–20)
CHLORIDE: 105 mmol/L (ref 101–111)
CO2: 26 mmol/L (ref 22–32)
CREATININE: 1.77 mg/dL — AB (ref 0.61–1.24)
Calcium: 9.5 mg/dL (ref 8.9–10.3)
GFR calc non Af Amer: 36 mL/min — ABNORMAL LOW (ref >60)
GFR, EST AFRICAN AMERICAN: 42 mL/min — AB (ref >60)
GLUCOSE: 107 mg/dL — AB (ref 65–99)
Potassium: 3.6 mmol/L (ref 3.5–5.1)
Sodium: 139 mmol/L (ref 135–145)

## 2016-04-09 LAB — CBC
HEMATOCRIT: 43.2 % (ref 40.0–52.0)
HEMOGLOBIN: 14.6 g/dL (ref 13.0–18.0)
MCH: 31.6 pg (ref 26.0–34.0)
MCHC: 33.7 g/dL (ref 32.0–36.0)
MCV: 93.6 fL (ref 80.0–100.0)
Platelets: 130 10*3/uL — ABNORMAL LOW (ref 150–440)
RBC: 4.61 MIL/uL (ref 4.40–5.90)
RDW: 13.9 % (ref 11.5–14.5)
WBC: 5.2 10*3/uL (ref 3.8–10.6)

## 2016-04-09 NOTE — Telephone Encounter (Signed)
Anticoagulant clearance faxed to Dr. Neoma Laming at this time for both Plavix and Aspirin.

## 2016-04-09 NOTE — Pre-Procedure Instructions (Signed)
Dr. Andree Elk notified of anesthesia consult, patient history, and EKG.Stated" patient should be okay".

## 2016-04-09 NOTE — Telephone Encounter (Signed)
Baker Janus called again in reference to patient stopping his Plavix and ASA. I told her that I would send Dr. Neoma Laming an Anti-Coagulant Clearance. Awaiting for clearance.

## 2016-04-09 NOTE — Patient Instructions (Signed)
  Your procedure is scheduled on: Apr 19, 2016 (Monday) Report to Day Surgery.([facility]) SECOND FLOOR To find out your arrival time please call [phone] between 1PM - 3PM on Apr 16, 2016 (Friday).  Remember: Instructions that are not followed completely may result in serious medical risk, up to and including death, or upon the discretion of your surgeon and anesthesiologist your surgery may need to be rescheduled.    __x__ 1. Do not eat food or drink liquids after midnight. No gum chewing or hard candies.     __x_ 2. No Alcohol for 24 hours before or after surgery.   ____ 3. Bring all medications with you on the day of surgery if instructed.    __x__ 4. Notify your doctor if there is any change in your medical condition     (cold, fever, infections).     Do not wear jewelry, make-up, hairpins, clips or nail polish.  Do not wear lotions, powders, or perfumes. You may wear deodorant.  Do not shave 48 hours prior to surgery. Men may shave face and neck.  Do not bring valuables to the hospital.    [facility] is not responsible for any belongings or valuables.               Contacts, dentures or bridgework may not be worn into surgery.  Leave your suitcase in the car. After surgery it may be brought to your room.  For patients admitted to the hospital, discharge time is determined by your                treatment team.   Patients discharged the day of surgery will not be allowed to drive home.   Please read over the following fact sheets that you were given:   Surgical Site Infection Prevention   _x___ Take these medicines the morning of surgery with A SIP OF WATER:    1. amLODipine (NORVASC) 5 MG tablet  2. hydrALAZINE (APRESOLINE) 100 MG tablet  3. metoprolol tartrate (LOPRESSOR) 25 MG tablet  4.  5.  6.  ____ Fleet Enema (as directed)   __x__ Use CHG Soap as directed  ____ Use inhalers on the day of surgery  ____ Stop metformin 2 days prior to surgery    ____  Take 1/2 of usual insulin dose the night before surgery and none on the morning of surgery.   _x_ Stop Coumadin/Plavix/aspirin on (Dr. Adonis Huguenin office to notify patient when to stop Aspirin and Plavix)    _x___ Stop Anti-inflammatories on (NO NSAIDS)   __x__ Stop supplements until after surgery.  (Stop Fish Oil now)  ____ Bring C-Pap to the hospital.

## 2016-04-09 NOTE — Telephone Encounter (Signed)
Pre admit has called and stated that per the patient he was advised by Dr Adonis Huguenin to be office his Plavix for 10 days. The nurse in pre admit feels that this is too long for him to be off of Plavix that long due to him having Cardiac stents. Patient is also on a lot of heart medications as well as Asprin 81 mg, which he states he was not advised when to stop it. Please call pre admit with all concerns of medication management. Patient is there for pre op at this time.   Surgery: 04/19/16 with Dr Gabrielle Dare cholecystectomy.

## 2016-04-12 ENCOUNTER — Telehealth: Payer: Self-pay | Admitting: General Surgery

## 2016-04-12 ENCOUNTER — Encounter: Payer: Self-pay | Admitting: *Deleted

## 2016-04-12 NOTE — Telephone Encounter (Signed)
Patient left a voice message and stated he was having his gallbladder removed 04/19/16 and wanted to know when he should stop his aspirin. Please call.

## 2016-04-12 NOTE — Telephone Encounter (Signed)
I spoke with Mrs. Robert Hansen and instructed her to call Dr. Marella Bile office to schedule an appointment per Dr.Kahn's office. They will not provide clearance until he is seen in the office. He was advised to continue on with Meds until he sees Dr. Chancy Milroy. Patient's wife stated they would call and make the appointment today. We discussed without having the clearance this would delay his surgery. She verbalized understanding.

## 2016-04-12 NOTE — Pre-Procedure Instructions (Signed)
LABS WITH ELEVATED CREAT. 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

## 2016-04-13 ENCOUNTER — Telehealth: Payer: Self-pay

## 2016-04-13 DIAGNOSIS — I10 Essential (primary) hypertension: Secondary | ICD-10-CM | POA: Diagnosis not present

## 2016-04-13 DIAGNOSIS — K219 Gastro-esophageal reflux disease without esophagitis: Secondary | ICD-10-CM | POA: Diagnosis not present

## 2016-04-13 DIAGNOSIS — I252 Old myocardial infarction: Secondary | ICD-10-CM | POA: Diagnosis not present

## 2016-04-13 DIAGNOSIS — I2510 Atherosclerotic heart disease of native coronary artery without angina pectoris: Secondary | ICD-10-CM | POA: Diagnosis not present

## 2016-04-13 NOTE — Telephone Encounter (Signed)
Called patient to let him know that we received cardiac clearance for him to have surgery done on 04/19/2016. I also told him that he needed to stop taking Plavix 5 days prior to surgery but to continue taking his aspirin. Patient stated that Dr.Kahn's office had called him to let him know.  Cardiac clearance was faxed to pre-admit.

## 2016-04-18 MED ORDER — DEXTROSE 5 % IV SOLN
1.0000 g | INTRAVENOUS | Status: AC
  Administered 2016-04-19: 1 g via INTRAVENOUS
  Filled 2016-04-18: qty 1

## 2016-04-19 ENCOUNTER — Ambulatory Visit: Payer: Commercial Managed Care - HMO | Admitting: Anesthesiology

## 2016-04-19 ENCOUNTER — Encounter
Admission: RE | Disposition: A | Discharge status: Home / Self Care | Payer: Self-pay | Source: Ambulatory Visit | Attending: General Surgery

## 2016-04-19 ENCOUNTER — Ambulatory Visit
Admission: RE | Admit: 2016-04-19 | Discharge: 2016-04-19 | Disposition: A | Discharge status: Home / Self Care | Payer: Commercial Managed Care - HMO | Source: Ambulatory Visit | Attending: General Surgery | Admitting: General Surgery

## 2016-04-19 ENCOUNTER — Ambulatory Visit: Payer: Commercial Managed Care - HMO

## 2016-04-19 ENCOUNTER — Encounter: Payer: Self-pay | Admitting: *Deleted

## 2016-04-19 DIAGNOSIS — E785 Hyperlipidemia, unspecified: Secondary | ICD-10-CM | POA: Diagnosis not present

## 2016-04-19 DIAGNOSIS — Z803 Family history of malignant neoplasm of breast: Secondary | ICD-10-CM | POA: Diagnosis not present

## 2016-04-19 DIAGNOSIS — Z833 Family history of diabetes mellitus: Secondary | ICD-10-CM | POA: Diagnosis not present

## 2016-04-19 DIAGNOSIS — K219 Gastro-esophageal reflux disease without esophagitis: Secondary | ICD-10-CM | POA: Insufficient documentation

## 2016-04-19 DIAGNOSIS — I25810 Atherosclerosis of coronary artery bypass graft(s) without angina pectoris: Secondary | ICD-10-CM | POA: Diagnosis not present

## 2016-04-19 DIAGNOSIS — I10 Essential (primary) hypertension: Secondary | ICD-10-CM | POA: Diagnosis not present

## 2016-04-19 DIAGNOSIS — K8000 Calculus of gallbladder with acute cholecystitis without obstruction: Secondary | ICD-10-CM | POA: Insufficient documentation

## 2016-04-19 DIAGNOSIS — Z419 Encounter for procedure for purposes other than remedying health state, unspecified: Secondary | ICD-10-CM

## 2016-04-19 DIAGNOSIS — K8020 Calculus of gallbladder without cholecystitis without obstruction: Secondary | ICD-10-CM | POA: Diagnosis not present

## 2016-04-19 DIAGNOSIS — E119 Type 2 diabetes mellitus without complications: Secondary | ICD-10-CM | POA: Insufficient documentation

## 2016-04-19 DIAGNOSIS — Z9861 Coronary angioplasty status: Secondary | ICD-10-CM | POA: Insufficient documentation

## 2016-04-19 DIAGNOSIS — K8010 Calculus of gallbladder with chronic cholecystitis without obstruction: Secondary | ICD-10-CM | POA: Insufficient documentation

## 2016-04-19 DIAGNOSIS — Z859 Personal history of malignant neoplasm, unspecified: Secondary | ICD-10-CM | POA: Diagnosis not present

## 2016-04-19 DIAGNOSIS — K819 Cholecystitis, unspecified: Secondary | ICD-10-CM | POA: Diagnosis not present

## 2016-04-19 DIAGNOSIS — I2510 Atherosclerotic heart disease of native coronary artery without angina pectoris: Secondary | ICD-10-CM | POA: Insufficient documentation

## 2016-04-19 DIAGNOSIS — Z885 Allergy status to narcotic agent status: Secondary | ICD-10-CM | POA: Diagnosis not present

## 2016-04-19 HISTORY — DX: Chronic kidney disease, unspecified: N18.9

## 2016-04-19 HISTORY — PX: CHOLECYSTECTOMY: SHX55

## 2016-04-19 LAB — GLUCOSE, CAPILLARY: GLUCOSE-CAPILLARY: 98 mg/dL (ref 65–99)

## 2016-04-19 SURGERY — LAPAROSCOPIC CHOLECYSTECTOMY
Anesthesia: General | Wound class: Clean Contaminated

## 2016-04-19 MED ORDER — CHLORHEXIDINE GLUCONATE 4 % EX LIQD
1.0000 | Freq: Once | CUTANEOUS | Status: DC
Start: 2016-04-19 — End: 2016-04-19

## 2016-04-19 MED ORDER — FAMOTIDINE 20 MG PO TABS
20.0000 mg | ORAL_TABLET | Freq: Once | ORAL | Status: AC
  Administered 2016-04-19: 20 mg via ORAL

## 2016-04-19 MED ORDER — SUGAMMADEX SODIUM 200 MG/2ML IV SOLN
INTRAVENOUS | Status: DC | PRN
  Administered 2016-04-19: 162.4 mg via INTRAVENOUS

## 2016-04-19 MED ORDER — BUPIVACAINE HCL (PF) 0.5 % IJ SOLN
INTRAMUSCULAR | Status: AC
Start: 2016-04-19 — End: 2016-04-19
  Filled 2016-04-19: qty 30

## 2016-04-19 MED ORDER — SODIUM CHLORIDE 0.9 % IV SOLN
INTRAVENOUS | Status: DC | PRN
  Administered 2016-04-19: 10 mL

## 2016-04-19 MED ORDER — SODIUM CHLORIDE 0.9 % IV SOLN
INTRAVENOUS | Status: DC
  Administered 2016-04-19: 09:00:00 via INTRAVENOUS

## 2016-04-19 MED ORDER — LACTATED RINGERS IV SOLN
INTRAVENOUS | Status: DC | PRN
  Administered 2016-04-19: 10:00:00 via INTRAVENOUS

## 2016-04-19 MED ORDER — LIDOCAINE HCL 1 % IJ SOLN
INTRAMUSCULAR | Status: DC | PRN
  Administered 2016-04-19: 20 mL via SUBCUTANEOUS

## 2016-04-19 MED ORDER — LIDOCAINE HCL (PF) 1 % IJ SOLN
INTRAMUSCULAR | Status: AC
  Filled 2016-04-19: qty 30

## 2016-04-19 MED ORDER — CHLORHEXIDINE GLUCONATE 4 % EX LIQD: 1.0000 "application " | Freq: Once | CUTANEOUS | Status: DC

## 2016-04-19 MED ORDER — FENTANYL CITRATE (PF) 100 MCG/2ML IJ SOLN: 25.0000 ug | INTRAMUSCULAR | Status: DC | PRN

## 2016-04-19 MED ORDER — FENTANYL CITRATE (PF) 100 MCG/2ML IJ SOLN
INTRAMUSCULAR | Status: DC | PRN
Start: 2016-04-19 — End: 2016-04-19
  Administered 2016-04-19 (×2): 50 ug via INTRAVENOUS

## 2016-04-19 MED ORDER — MIDAZOLAM HCL 2 MG/2ML IJ SOLN
INTRAMUSCULAR | Status: DC | PRN
  Administered 2016-04-19: 2 mg via INTRAVENOUS

## 2016-04-19 MED ORDER — DEXAMETHASONE SODIUM PHOSPHATE 10 MG/ML IJ SOLN
INTRAMUSCULAR | Status: DC | PRN
Start: 2016-04-19 — End: 2016-04-19
  Administered 2016-04-19: 10 mg via INTRAVENOUS

## 2016-04-19 MED ORDER — ONDANSETRON HCL 4 MG/2ML IJ SOLN: 4.0000 mg | Freq: Once | INTRAMUSCULAR | Status: DC | PRN

## 2016-04-19 MED ORDER — PROPOFOL 10 MG/ML IV BOLUS
INTRAVENOUS | Status: DC | PRN
  Administered 2016-04-19: 120 mg via INTRAVENOUS

## 2016-04-19 MED ORDER — FAMOTIDINE 20 MG PO TABS
ORAL_TABLET | ORAL | Status: AC
  Administered 2016-04-19: 20 mg via ORAL
  Filled 2016-04-19: qty 1

## 2016-04-19 MED ORDER — ONDANSETRON HCL 4 MG/2ML IJ SOLN
INTRAMUSCULAR | Status: DC | PRN
  Administered 2016-04-19: 4 mg via INTRAVENOUS

## 2016-04-19 MED ORDER — ACETAMINOPHEN 10 MG/ML IV SOLN
INTRAVENOUS | Status: DC | PRN
  Administered 2016-04-19: 1000 mg via INTRAVENOUS

## 2016-04-19 MED ORDER — LIDOCAINE HCL (CARDIAC) 20 MG/ML IV SOLN
INTRAVENOUS | Status: DC | PRN
  Administered 2016-04-19: 60 mg via INTRAVENOUS

## 2016-04-19 MED ORDER — ROCURONIUM BROMIDE 100 MG/10ML IV SOLN
INTRAVENOUS | Status: DC | PRN
  Administered 2016-04-19: 5 mg via INTRAVENOUS
  Administered 2016-04-19: 40 mg via INTRAVENOUS

## 2016-04-19 MED ORDER — ACETAMINOPHEN 10 MG/ML IV SOLN
INTRAVENOUS | Status: AC
  Filled 2016-04-19: qty 100

## 2016-04-19 SURGICAL SUPPLY — 47 items
APPLIER CLIP ROT 10 11.4 M/L (STAPLE) ×3
BAG COUNTER SPONGE EZ (MISCELLANEOUS) IMPLANT
BLADE SURG SZ11 CARB STEEL (BLADE) ×3 IMPLANT
BULB RESERV EVAC DRAIN JP 100C (MISCELLANEOUS) IMPLANT
CANISTER SUCT 1200ML W/VALVE (MISCELLANEOUS) ×3 IMPLANT
CATH CHOLANG 76X19 KUMAR (CATHETERS) ×3 IMPLANT
CHLORAPREP W/TINT 26ML (MISCELLANEOUS) ×3 IMPLANT
CLIP APPLIE ROT 10 11.4 M/L (STAPLE) ×1 IMPLANT
CLOSURE WOUND 1/2 X4 (GAUZE/BANDAGES/DRESSINGS)
CONRAY 60ML FOR OR (MISCELLANEOUS) ×3 IMPLANT
COUNTER SPONGE BAG EZ (MISCELLANEOUS)
DISSECTOR KITTNER STICK (MISCELLANEOUS) IMPLANT
DISSECTORS/KITTNER STICK (MISCELLANEOUS)
DRAIN CHANNEL JP 19F (MISCELLANEOUS) IMPLANT
DRAPE SHEET LG 3/4 BI-LAMINATE (DRAPES) ×3 IMPLANT
ELECT REM PT RETURN 9FT ADLT (ELECTROSURGICAL) ×3
ELECTRODE REM PT RTRN 9FT ADLT (ELECTROSURGICAL) ×1 IMPLANT
ENDOLOOP SUT PDS II  0 18 (SUTURE) ×2
ENDOLOOP SUT PDS II 0 18 (SUTURE) ×1 IMPLANT
GAUZE SPONGE 4X4 12PLY STRL (GAUZE/BANDAGES/DRESSINGS) ×3 IMPLANT
GLOVE BIO SURGEON STRL SZ7.5 (GLOVE) ×3 IMPLANT
GLOVE INDICATOR 8.0 STRL GRN (GLOVE) ×3 IMPLANT
GOWN STRL REUS W/ TWL LRG LVL3 (GOWN DISPOSABLE) ×3 IMPLANT
GOWN STRL REUS W/TWL LRG LVL3 (GOWN DISPOSABLE) ×6
IRRIGATION STRYKERFLOW (MISCELLANEOUS) ×1 IMPLANT
IRRIGATOR STRYKERFLOW (MISCELLANEOUS) ×3
IV NS 1000ML (IV SOLUTION)
IV NS 1000ML BAXH (IV SOLUTION) IMPLANT
L-HOOK LAP DISP 36CM (ELECTROSURGICAL) ×3
LABEL OR SOLS (LABEL) ×3 IMPLANT
LHOOK LAP DISP 36CM (ELECTROSURGICAL) ×1 IMPLANT
NEEDLE HYPO 25X1 1.5 SAFETY (NEEDLE) ×3 IMPLANT
NEEDLE VERESS 14GA 120MM (NEEDLE) ×3 IMPLANT
NS IRRIG 500ML POUR BTL (IV SOLUTION) ×3 IMPLANT
PACK LAP CHOLECYSTECTOMY (MISCELLANEOUS) ×3 IMPLANT
PENCIL ELECTRO HAND CTR (MISCELLANEOUS) ×3 IMPLANT
POUCH ENDO CATCH 10MM SPEC (MISCELLANEOUS) ×3 IMPLANT
SCISSORS METZENBAUM CVD 33 (INSTRUMENTS) ×3 IMPLANT
SLEEVE ENDOPATH XCEL 5M (ENDOMECHANICALS) ×6 IMPLANT
STRIP CLOSURE SKIN 1/2X4 (GAUZE/BANDAGES/DRESSINGS) IMPLANT
SUT MNCRL 4-0 (SUTURE) ×2
SUT MNCRL 4-0 27XMFL (SUTURE) ×1
SUTURE MNCRL 4-0 27XMF (SUTURE) ×1 IMPLANT
SWABSTK COMLB BENZOIN TINCTURE (MISCELLANEOUS) ×3 IMPLANT
TROCAR XCEL 12X100 BLDLESS (ENDOMECHANICALS) ×3 IMPLANT
TROCAR XCEL NON-BLD 5MMX100MML (ENDOMECHANICALS) ×3 IMPLANT
TUBING INSUFFLATOR HI FLOW (MISCELLANEOUS) ×3 IMPLANT

## 2016-04-19 NOTE — Discharge Instructions (Signed)
Laparoscopic Cholecystectomy, Care After These instructions give you information about caring for yourself after your procedure. Your doctor may also give you more specific instructions. Call your doctor if you have any problems or questions after your procedure. HOME CARE Incision Care  Follow instructions from your doctor about how to take care of your cuts from surgery (incisions). Make sure you:  Wash your hands with soap and water before you change your bandage (dressing). If you cannot use soap and water, use hand sanitizer.  Change your bandage as told by your doctor.  Leave stitches (sutures), skin glue, or skin tape (adhesive) strips in place. They may need to stay in place for 2 weeks or longer. If tape strips get loose and curl up, you may trim the loose edges. Do not remove tape strips completely unless your doctor says it is okay. OK to remove external dressing in 48 hours  Do not take baths, swim, or use a hot tub until your doctor says it is okay. Ask your doctor if you can take showers. You may only be allowed to take sponge baths. OK to shower in 24 hours.  General Instructions  Take over-the-counter and prescription medicines only as told by your doctor.  Do not drive or use heavy machinery while taking prescription pain medicine.  Return to your normal diet as told by your doctor. OK to restart plavix tomorrow  Do not lift anything that is heavier than 10 lb (4.5 kg).  Do not play contact sports for 1 week or until your doctor says it is okay. GET HELP IF:  You have redness, swelling, or pain at the site of your surgical cuts.  You have fluid, blood, or pus coming from your cuts.  You notice a bad smell coming from your cut area.  Your surgical cuts break open.  You have a fever. GET HELP RIGHT AWAY IF:   You have a rash.  You have trouble breathing.  You have chest pain.  You have pain in your shoulders (shoulder strap areas) that is getting  worse.  You pass out (faint) or feel dizzy while you are standing.  You have very bad pain in your belly (abdomen).  You feel sick to your stomach (nauseous) or throw up (vomit) for more than 1 day.   This information is not intended to replace advice given to you by your health care provider. Make sure you discuss any questions you have with your health care provider.   Document Released: 08/24/2008 Document Revised: 08/06/2015 Document Reviewed: 06/27/2013 Elsevier Interactive Patient Education 2016 [place]   1) The drugs that you were given will stay in your system until tomorrow so for the next 24 hours you should not:  A) Drive an automobile B) Make any legal decisions C) Drink any alcoholic beverage   2) You may resume regular meals tomorrow.  Today it is better to start with liquids and gradually work up to solid foods.  You may eat anything you prefer, but it is better to start with liquids, then soup and crackers, and gradually work up to solid foods.   3) Please notify your doctor immediately if you have any unusual bleeding, trouble breathing, redness and pain at the surgery site, drainage, fever, or pain not relieved by medication.    4) Additional Instructions:        Please contact your physician with any problems or Same Day Surgery at [phone], Monday through  Friday 6 am to 4 pm, or [place] at [facility] number at [phone].

## 2016-04-19 NOTE — Transfer of Care (Signed)
Immediate Anesthesia Transfer of Care Note  Patient: Robert Hansen  Procedure(s) Performed: Procedure(s): LAPAROSCOPIC CHOLECYSTECTOMY (N/A)  Patient Location: PACU  Anesthesia Type:General  Level of Consciousness: awake, alert  and oriented  Airway & Oxygen Therapy: Patient Spontanous Breathing and Patient connected to face mask oxygen  Post-op Assessment: Report given to RN and Post -op Vital signs reviewed and stable  Post vital signs: Reviewed and stable  Last Vitals:  Filed Vitals:   04/19/16 0918 04/19/16 1205  BP: 135/79 108/65  Pulse: 55 52  Temp: 36.5 C 36.2 C  Resp: 16 19    Last Pain:  Filed Vitals:   04/19/16 1211  PainSc: 0-No pain         Complications: No apparent anesthesia complications

## 2016-04-19 NOTE — Anesthesia Postprocedure Evaluation (Signed)
Anesthesia Post Note  Patient: Robert Hansen  Procedure(s) Performed: Procedure(s) (LRB): LAPAROSCOPIC CHOLECYSTECTOMY (N/A)  Patient location during evaluation: PACU Anesthesia Type: General Level of consciousness: awake Pain management: pain level controlled Vital Signs Assessment: post-procedure vital signs reviewed and stable Respiratory status: spontaneous breathing Cardiovascular status: blood pressure returned to baseline Anesthetic complications: no    Last Vitals:  Filed Vitals:   04/19/16 1306 04/19/16 1320  BP: 129/66 141/70  Pulse: 52 54  Temp: 36.5 C   Resp: 14 14    Last Pain:  Filed Vitals:   04/19/16 1321  PainSc: 0-No pain                 VAN STAVEREN,Rich Paprocki

## 2016-04-19 NOTE — H&P (View-Only) (Signed)
Outpatient Surgical Follow Up  04/05/2016  Robert Hansen is an 75 y.o. male.   Chief Complaint  Patient presents with  . Follow-up    cholecystitis    HPI: 75 year old male returns to clinic for follow up of cholecystitis. He was admitted to the hospital for this two weeks ago and treated with antibiotics which allowed for complete resolution of his symptoms. Patient reports since surgery that he's not had any nausea, vomiting, chest pain, shortness of breath, diarrhea, constipation. He continues to have some tenderness over the area of his gallbladder but it is much improved from his hospital patient. He has been abiding by a low-fat diet which has helped alleviate his symptoms. He was on aspirin and Plavix and stopped it after his admission and desires now to have his gallbladder removed. He is otherwise in his usual state of health.  Past Medical History  Diagnosis Date  . Hyperlipidemia   . Allergy   . Hypertension   . GERD (gastroesophageal reflux disease)   . Cancer ([place])   . Cholelithiasis     Past Surgical History  Procedure Laterality Date  . Colon surgery    . Coronary angioplasty with stent placement      Family History  Problem Relation Age of Onset  . Diabetes Mother   . Cancer Mother     breast    Social History:  reports that he has never smoked. He has never used smokeless tobacco. He reports that he drinks alcohol. He reports that he does not use illicit drugs.  Allergies:  Allergies  Allergen Reactions  . Codeine Nausea And Vomiting  . Morphine Nausea Only    Medications reviewed.    ROS A multipoint review of systems was completed. All pertinent positives and negatives are documented within the history of present illness and remainder are negative.   BP 149/85 mmHg  Pulse 57  Temp(Src) 97.9 F (36.6 C) (Oral)  Ht 5\' 6"  (1.676 m)  Wt 81.874 kg (180 lb 8 oz)  BMI 29.15 kg/m2  Physical Exam Gen.: No acute distress Neck: Supple and  nontender Lymph nodes: No evidence of cervical or clavicular lymphadenopathy Chest: Clear to auscultation with symmetric chest wall motion Heart: Regular rate but with an audible systolic murmur Abdomen: Soft, minimally tender to deep palpation over the right upper quadrant, nondistended. Negative Murphy's, guarding, peritoneal signs. Extremities: Moves ostomy is well Neuro: Cranial nerves grossly intact Psych: Alert and oriented 3    No results found for this or any previous visit (from the past 48 hour(s)). No results found.  Assessment/Plan:  1. Cholecystitis 74 year old male with a recent hospitalization for acute cholecystitis and was treated with antibiotics. He has done well since then. I discussed the procedure of laparoscopic cholecystectomy in detail.  The patient was given Neurosurgeon.  We discussed the risks and benefits of a laparoscopic cholecystectomy and possible cholangiogram including, but not limited to bleeding, infection, injury to surrounding structures such as the intestine or liver, bile leak, retained gallstones, need to convert to an open procedure, prolonged diarrhea, blood clots such as  DVT, common bile duct injury, anesthesia risks, and possible need for additional procedures.  The likelihood of improvement in symptoms and return to the patient's normal status is good. We discussed the typical post-operative recovery course.  Patient voiced understanding and wishes to proceed. We will plan to proceed on Monday, May 22.      Clayburn Pert, MD [facility] General Surgeon  04/05/2016,9:13 AM

## 2016-04-19 NOTE — Brief Op Note (Signed)
04/19/2016  11:46 AM  PATIENT:  Robert Hansen  75 y.o. male  PRE-OPERATIVE DIAGNOSIS:  CHOLECYSTITIS  POST-OPERATIVE DIAGNOSIS:  CHOLECYSTITIS  PROCEDURE:  Procedure(s): LAPAROSCOPIC CHOLECYSTECTOMY (N/A)  SURGEON:  Surgeon(s) and Role:    * Clayburn Pert, MD - Primary  PHYSICIAN ASSISTANT:   ASSISTANTS: none   ANESTHESIA:   general  EBL:     BLOOD ADMINISTERED:none  DRAINS: none   LOCAL MEDICATIONS USED:  MARCAINE   , XYLOCAINE  and Amount: 20 ml  SPECIMEN:  Source of Specimen:  gallbladder  DISPOSITION OF SPECIMEN:  PATHOLOGY  COUNTS:  YES  TOURNIQUET:  * No tourniquets in log *  DICTATION: .Dragon Dictation  PLAN OF CARE: Discharge to home after PACU  PATIENT DISPOSITION:  PACU - hemodynamically stable.   Delay start of Pharmacological VTE agent (>24hrs) due to surgical blood loss or risk of bleeding: not applicable

## 2016-04-19 NOTE — Transfer of Care (Signed)
Immediate Anesthesia Transfer of Care Note  Patient: Robert Hansen  Procedure(s) Performed: Procedure(s): LAPAROSCOPIC CHOLECYSTECTOMY (N/A)  Patient Location: PACU  Anesthesia Type:General  Level of Consciousness: awake, alert  and oriented  Airway & Oxygen Therapy: Patient Spontanous Breathing and Patient connected to face mask oxygen  Post-op Assessment: Report given to RN and Post -op Vital signs reviewed and stable  Post vital signs: Reviewed and stable  Last Vitals:  Filed Vitals:   04/19/16 0918  BP: 135/79  Pulse: 55  Temp: 36.5 C  Resp: 16    Last Pain: There were no vitals filed for this visit.       Complications: No apparent anesthesia complications

## 2016-04-19 NOTE — Anesthesia Preprocedure Evaluation (Signed)
Anesthesia Evaluation  Patient identified by MRN, date of birth, ID band Patient awake    Airway Mallampati: II       Dental  (+) Upper Dentures, Lower Dentures   Pulmonary neg pulmonary ROS,    breath sounds clear to auscultation       Cardiovascular Exercise Tolerance: Good hypertension, Pt. on medications and Pt. on home beta blockers + CAD and + Cardiac Stents   Rhythm:Regular Rate:Normal     Neuro/Psych    GI/Hepatic GERD  ,  Endo/Other  diabetes  Renal/GU      Musculoskeletal   Abdominal Normal abdominal exam  (+)   Peds  Hematology negative hematology ROS (+)   Anesthesia Other Findings   Reproductive/Obstetrics                             Anesthesia Physical Anesthesia Plan  ASA: III  Anesthesia Plan: General   Post-op Pain Management:    Induction: Intravenous  Airway Management Planned: Oral ETT  Additional Equipment:   Intra-op Plan:   Post-operative Plan: Extubation in OR  Informed Consent: I have reviewed the patients History and Physical, chart, labs and discussed the procedure including the risks, benefits and alternatives for the proposed anesthesia with the patient or authorized representative who has indicated his/her understanding and acceptance.     Plan Discussed with: CRNA  Anesthesia Plan Comments:         Anesthesia Quick Evaluation

## 2016-04-19 NOTE — Interval H&P Note (Signed)
History and Physical Interval Note:  04/19/2016 9:54 AM  Robert Hansen  has presented today for surgery, with the diagnosis of CHOLECYSTITIS  The various methods of treatment have been discussed with the patient and family. After consideration of risks, benefits and other options for treatment, the patient has consented to  Procedure(s): LAPAROSCOPIC CHOLECYSTECTOMY (N/A) as a surgical intervention .  The patient's history has been reviewed, patient examined, no change in status, stable for surgery.  I have reviewed the patient's chart and labs.  Questions were answered to the patient's satisfaction.     Clayburn Pert

## 2016-04-19 NOTE — Op Note (Signed)
Laparoscopic Cholecystectomy  Pre-operative Diagnosis: Previous cholecystitis  Post-operative Diagnosis: Same  Procedure: Laparoscopic cholecystectomy with cholangiogram  Surgeon: Juanda Crumble T. Adonis Huguenin, MD FACS  Anesthesia: Gen. with endotracheal tube  Assistant: None  Procedure Details  The patient was seen again in the Holding Room. The benefits, complications, treatment options, and expected outcomes were discussed with the patient. The risks of bleeding, infection, recurrence of symptoms, failure to resolve symptoms, bile duct damage, bile duct leak, retained common bile duct stone, bowel injury, any of which could require further surgery and/or ERCP, stent, or papillotomy were reviewed with the patient. The likelihood of improving the patient's symptoms with return to their baseline status is good.  The patient and/or family concurred with the proposed plan, giving informed consent.  The patient was taken to Operating Room, identified as Shirl Harris and the procedure verified as Laparoscopic Cholecystectomy.  A Time Out was held and the above information confirmed.  Prior to the induction of general anesthesia, antibiotic prophylaxis was administered. VTE prophylaxis was in place. General endotracheal anesthesia was then administered and tolerated well. After the induction, the abdomen was prepped with Chloraprep and draped in the sterile fashion. The patient was positioned in the supine position.  Local anesthetic  was injected into the skin in the right upper quadrant in the midclavicular line 2 finger breaths below the costal margin and an incision made. The Veress needle was placed. Pneumoperitoneum was then created with CO2 and tolerated well without any adverse changes in the patient's vital signs. A 4mm port was placed in the incision site and the abdominal cavity was explored.  Two 5-mm ports were placed, one in the periumbilical, and in the right upper quadrant and a 12 mm  epigastric port was placed all under direct vision. All skin incisions  were infiltrated with a local anesthetic agent before making the incision and placing the trocars.   The patient was positioned  in reverse Trendelenburg, tilted slightly to the patient's left.  The gallbladder was identified, the fundus grasped and retracted cephalad. Adhesions were lysed bluntly. The infundibulum was grasped and retracted laterally, exposing the peritoneum overlying the triangle of Calot. This was then divided and exposed in a blunt fashion. A critical view of the cystic duct and cystic artery was obtained.  The cystic duct was clearly identified and bluntly dissected.   The gallbladder was noted to be folded on itself and when unfolded and anterior and posterior artery were easily identified and serially clipped with endoclips and cut in between with Endo Shears. The resulting cystic duct was noted to be dilated more than expected. Given this finding the decision was made to perform an interoperative cholangiogram. Using a Kumar clamp and catheter a cholangiogram was easily performed. This showed a long cystic duct going to the common duct with immediate entry into the duodenum. It also went retrograde up into the hepatic duction with a right and left. After this was done the previous identified and large duct was serially clipped with endoclips and cut. As expected it was longer than our clips and a PDS Endoloop was prepped the field and placed over the remaining duct stump and secured proximal to our previously placed clips.  The gallbladder was taken from the gallbladder fossa in a retrograde fashion with the electrocautery. The gallbladder was removed and placed in an Endocatch bag. The liver bed was irrigated and inspected. Hemostasis was achieved with the electrocautery. Copious irrigation was utilized and was repeatedly aspirated until clear.  The gallbladder and Endocatch sac were then removed through the  epigastric port site.   Inspection of the right upper quadrant was performed. No bleeding, bile duct injury or leak, or bowel injury was noted. Pneumoperitoneum was released. 4-0 subcuticular Monocryl was used to close the skin. Steristrips and Mastisol and sterile dressings were  applied.  The patient was then extubated and brought to the recovery room in stable condition. Sponge, lap, and needle counts were correct at closure and at the conclusion of the case.   Findings: Previous Cholecystitis   Estimated Blood Loss: 20 mL         Drains: None         Specimens: Gallbladder           Complications: none               Jackquline Branca T. Adonis Huguenin, MD, FACS

## 2016-04-19 NOTE — Anesthesia Procedure Notes (Signed)
Procedure Name: Intubation Performed by: Jenetta Downer Pre-anesthesia Checklist: Patient identified, Emergency Drugs available, Suction available and Patient being monitored Patient Re-evaluated:Patient Re-evaluated prior to inductionOxygen Delivery Method: Circle system utilized Preoxygenation: Pre-oxygenation with 100% oxygen Intubation Type: IV induction Ventilation: Mask ventilation without difficulty Laryngoscope Size: Miller and 2 Grade View: Grade I Tube type: Oral Tube size: 7.0 mm Number of attempts: 1 Airway Equipment and Method: Stylet

## 2016-04-20 ENCOUNTER — Telehealth: Payer: Self-pay | Admitting: General Surgery

## 2016-04-20 LAB — SURGICAL PATHOLOGY

## 2016-04-20 NOTE — Telephone Encounter (Signed)
Patient called this morning and wanted to know if Dr. Adonis Huguenin told him after 48 hours can he take the gauze off when he showers? If he is not home, please leave a message on his voice mail per patient.

## 2016-04-20 NOTE — Telephone Encounter (Signed)
I spoke with patients wife, Mr. Koetje was sleeping. I let her know the bandage could be removed and to wash area with soap and water and pat dry completely. She verbalized understanding.

## 2016-04-28 ENCOUNTER — Ambulatory Visit (INDEPENDENT_AMBULATORY_CARE_PROVIDER_SITE_OTHER): Payer: Commercial Managed Care - HMO | Admitting: General Surgery

## 2016-04-28 ENCOUNTER — Encounter: Payer: Self-pay | Admitting: General Surgery

## 2016-04-28 VITALS — BP 158/80 | HR 58 | Temp 97.8°F | Ht 66.0 in | Wt 179.2 lb

## 2016-04-28 DIAGNOSIS — Z4889 Encounter for other specified surgical aftercare: Secondary | ICD-10-CM

## 2016-04-28 NOTE — Progress Notes (Signed)
Outpatient Surgical Follow Up  04/28/2016  Robert Hansen is an 75 y.o. male.   Chief Complaint  Patient presents with  . Routine Post Op    Cholecystectomy-(Dr. Adonis Huguenin 5/22)    HPI: 75 year old male returns to clinic 9 days status post laparoscopic cholecystectomy. He denies any pain, fevers, chills, nausea and vomiting. He's tolerating regular diet and his pain has been well controlled without any pain medications. He is also having normal bowel function.  Past Medical History  Diagnosis Date  . Hyperlipidemia   . Allergy   . Hypertension   . GERD (gastroesophageal reflux disease)   . Cancer ([place])   . Cholelithiasis   . Coronary artery disease   . Heart murmur   . Diabetes mellitus without complication ([facility])     borderline  . Arthritis   . Chronic kidney disease     CREAT 1.9 3 YEARS AGO    Past Surgical History  Procedure Laterality Date  . Coronary angioplasty with stent placement      Dr. Humphrey Rolls, [facility]  . Colon surgery      Dr. Pat Patrick, [facility]  . Cholecystectomy N/A 04/19/2016    Procedure: LAPAROSCOPIC CHOLECYSTECTOMY;  Surgeon: Clayburn Pert, MD;  Location: [facility] ORS;  Service: General;  Laterality: N/A;    Family History  Problem Relation Age of Onset  . Diabetes Mother   . Cancer Mother     breast    Social History:  reports that he has never smoked. He has never used smokeless tobacco. He reports that he drinks alcohol. He reports that he does not use illicit drugs.  Allergies:  Allergies  Allergen Reactions  . Codeine Nausea And Vomiting  . Morphine Nausea Only    Medications reviewed.    ROS A multipoint review of systems was completed, all pertinent positives and negatives are documented within the history of present illness and remainder are negative.   BP 158/80 mmHg  Pulse 58  Temp(Src) 97.8 F (36.6 C) (Oral)  Ht 5\' 6"  (1.676 m)  Wt 81.285 kg (179 lb 3.2 oz)  BMI 28.94 kg/m2  Physical Exam  Gen.: No acute distress Chest: Clear to  auscultation Heart: Regular rhythm Abdomen: Soft, nontender, nondistended. Well approximated laparoscopic cholecystectomy incision sites. There is evidence of ecchymosis around the upper midline and most lateral right upper quadrant incisions. No evidence of purulence or drainage.   No results found for this or any previous visit (from the past 48 hour(s)). No results found.  Assessment/Plan:  1. Aftercare following surgery 75 year old male status post laparoscopic cholecystectomy. Pathology reviewed with the patient. Provided with routine postoperative instructions for returning to activities. Also discussed signs and symptoms of infection and for him to return to clinic should they occur. Patient voiced understanding and follow-up in clinic on an as-needed basis.     Clayburn Pert, MD FACS General Surgeon  04/28/2016,9:16 AM

## 2016-04-28 NOTE — Patient Instructions (Signed)
Please call our office if you have any questions or concerns.  

## 2016-05-04 ENCOUNTER — Other Ambulatory Visit: Payer: Self-pay | Admitting: Family Medicine

## 2016-05-04 ENCOUNTER — Encounter: Payer: Self-pay | Admitting: Family Medicine

## 2016-05-04 DIAGNOSIS — I340 Nonrheumatic mitral (valve) insufficiency: Secondary | ICD-10-CM

## 2016-05-06 DIAGNOSIS — I2510 Atherosclerotic heart disease of native coronary artery without angina pectoris: Secondary | ICD-10-CM | POA: Diagnosis not present

## 2016-05-06 DIAGNOSIS — I739 Peripheral vascular disease, unspecified: Secondary | ICD-10-CM | POA: Diagnosis not present

## 2016-05-06 DIAGNOSIS — K219 Gastro-esophageal reflux disease without esophagitis: Secondary | ICD-10-CM | POA: Diagnosis not present

## 2016-05-06 DIAGNOSIS — I340 Nonrheumatic mitral (valve) insufficiency: Secondary | ICD-10-CM | POA: Diagnosis not present

## 2016-05-06 DIAGNOSIS — I10 Essential (primary) hypertension: Secondary | ICD-10-CM | POA: Diagnosis not present

## 2016-05-06 DIAGNOSIS — I252 Old myocardial infarction: Secondary | ICD-10-CM | POA: Diagnosis not present

## 2016-05-06 DIAGNOSIS — R079 Chest pain, unspecified: Secondary | ICD-10-CM | POA: Diagnosis not present

## 2016-05-10 DIAGNOSIS — I739 Peripheral vascular disease, unspecified: Secondary | ICD-10-CM | POA: Diagnosis not present

## 2016-05-10 DIAGNOSIS — I2510 Atherosclerotic heart disease of native coronary artery without angina pectoris: Secondary | ICD-10-CM | POA: Diagnosis not present

## 2016-05-10 DIAGNOSIS — R079 Chest pain, unspecified: Secondary | ICD-10-CM | POA: Diagnosis not present

## 2016-05-13 DIAGNOSIS — I10 Essential (primary) hypertension: Secondary | ICD-10-CM | POA: Diagnosis not present

## 2016-05-13 DIAGNOSIS — K219 Gastro-esophageal reflux disease without esophagitis: Secondary | ICD-10-CM | POA: Diagnosis not present

## 2016-05-13 DIAGNOSIS — I2510 Atherosclerotic heart disease of native coronary artery without angina pectoris: Secondary | ICD-10-CM | POA: Diagnosis not present

## 2016-05-17 DIAGNOSIS — I2510 Atherosclerotic heart disease of native coronary artery without angina pectoris: Secondary | ICD-10-CM | POA: Diagnosis not present

## 2016-05-24 DIAGNOSIS — K219 Gastro-esophageal reflux disease without esophagitis: Secondary | ICD-10-CM | POA: Diagnosis not present

## 2016-05-24 DIAGNOSIS — I2510 Atherosclerotic heart disease of native coronary artery without angina pectoris: Secondary | ICD-10-CM | POA: Diagnosis not present

## 2016-05-24 DIAGNOSIS — I10 Essential (primary) hypertension: Secondary | ICD-10-CM | POA: Diagnosis not present

## 2016-06-08 DIAGNOSIS — I10 Essential (primary) hypertension: Secondary | ICD-10-CM | POA: Diagnosis not present

## 2016-06-08 DIAGNOSIS — Z01810 Encounter for preprocedural cardiovascular examination: Secondary | ICD-10-CM | POA: Diagnosis not present

## 2016-06-08 DIAGNOSIS — I739 Peripheral vascular disease, unspecified: Secondary | ICD-10-CM | POA: Diagnosis not present

## 2016-06-08 DIAGNOSIS — I2510 Atherosclerotic heart disease of native coronary artery without angina pectoris: Secondary | ICD-10-CM | POA: Diagnosis not present

## 2016-06-09 DIAGNOSIS — Z01810 Encounter for preprocedural cardiovascular examination: Secondary | ICD-10-CM | POA: Diagnosis not present

## 2016-08-24 DIAGNOSIS — I739 Peripheral vascular disease, unspecified: Secondary | ICD-10-CM | POA: Diagnosis not present

## 2016-08-24 DIAGNOSIS — I2510 Atherosclerotic heart disease of native coronary artery without angina pectoris: Secondary | ICD-10-CM | POA: Diagnosis not present

## 2016-08-24 DIAGNOSIS — I10 Essential (primary) hypertension: Secondary | ICD-10-CM | POA: Diagnosis not present

## 2016-08-24 DIAGNOSIS — K219 Gastro-esophageal reflux disease without esophagitis: Secondary | ICD-10-CM | POA: Diagnosis not present

## 2016-08-27 DIAGNOSIS — I739 Peripheral vascular disease, unspecified: Secondary | ICD-10-CM | POA: Diagnosis not present

## 2016-09-09 IMAGING — CR DG CHOLANGIOGRAM OPERATIVE
2 series · 11 of 11 positions shown · non-contrast
Comparison: 03/15/2016, CT 07/17/2015

CLINICAL DATA: 75-year-old male with a history of cholelithiasis.

EXAM:
INTRAOPERATIVE CHOLANGIOGRAM
TECHNIQUE: Cholangiographic images from the C-arm fluoroscopic device were
submitted for interpretation post-operatively. Please see the
procedural report for the amount of contrast and the fluoroscopy
time utilized.

[cont. (1 of 2)]
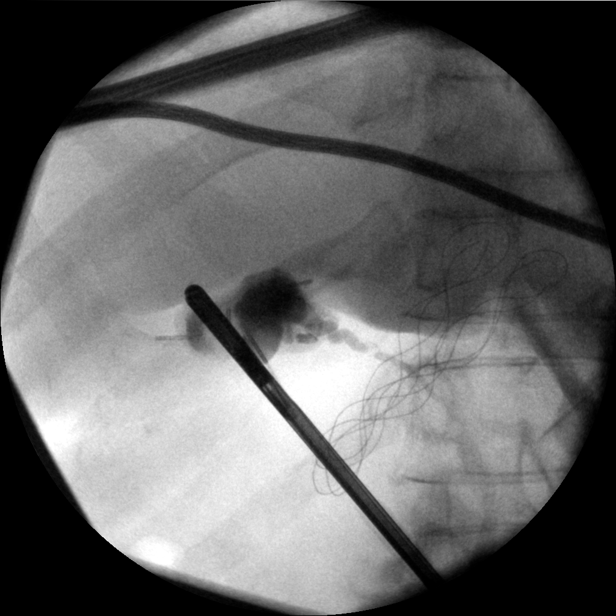

[Series 6: cont. · 10 of 47 frames shown (2 of 2)]
[frame 1/47]
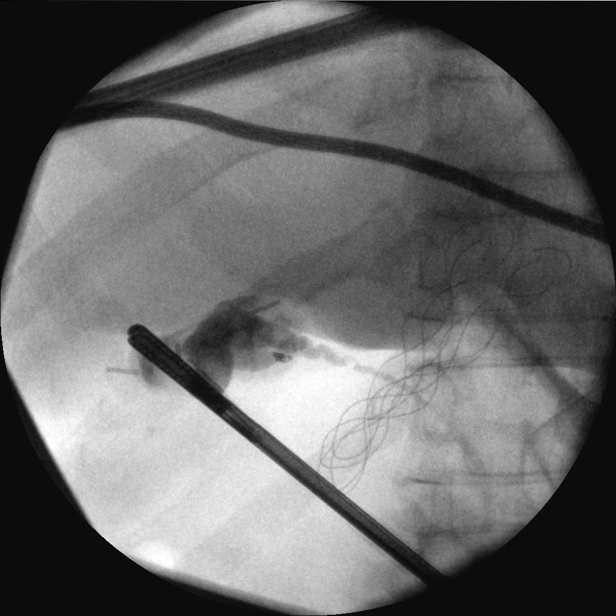
[frame 6/47]
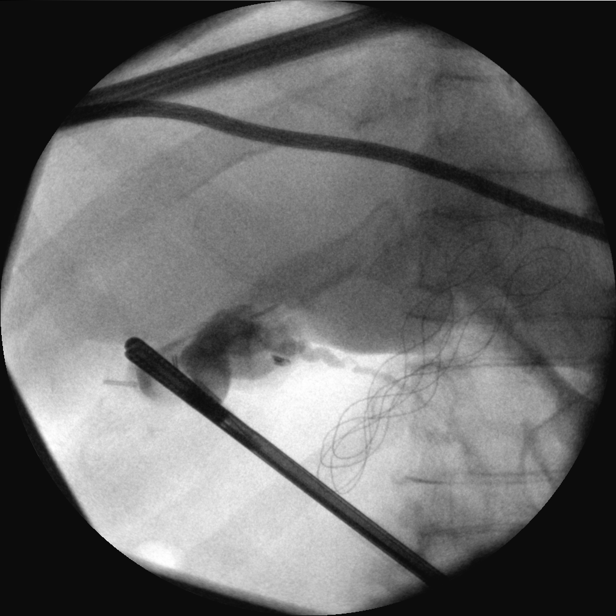
[frame 11/47]
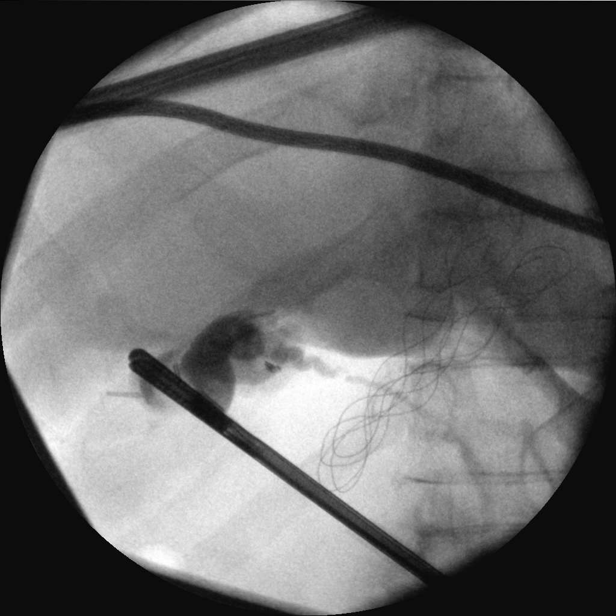
[frame 16/47]
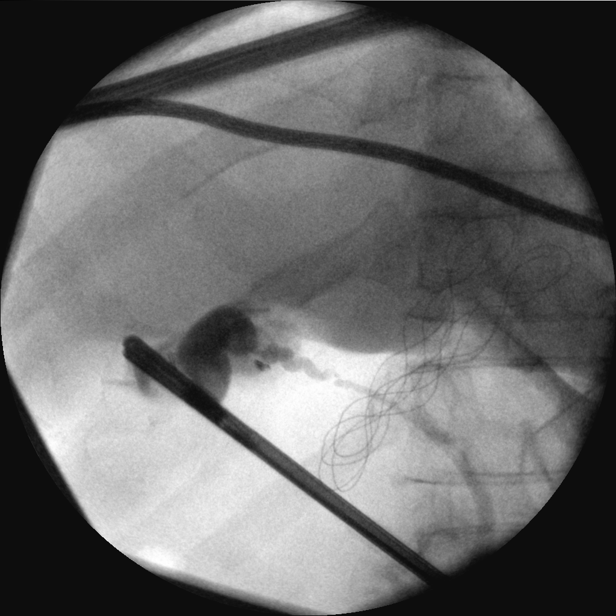
[frame 21/47]
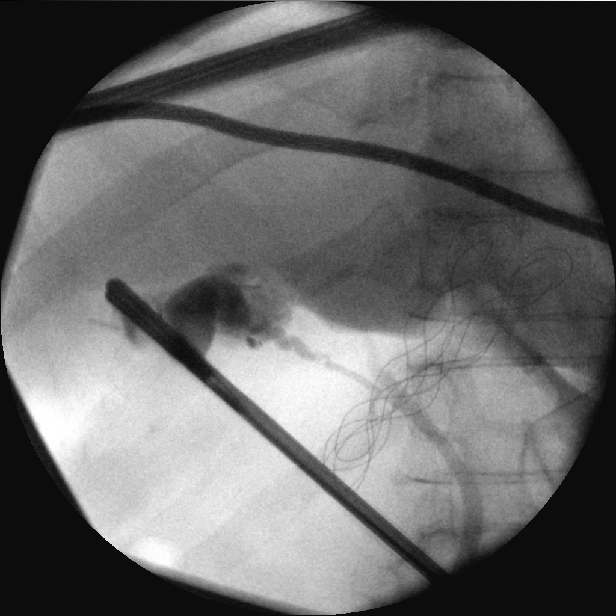
[frame 26/47]
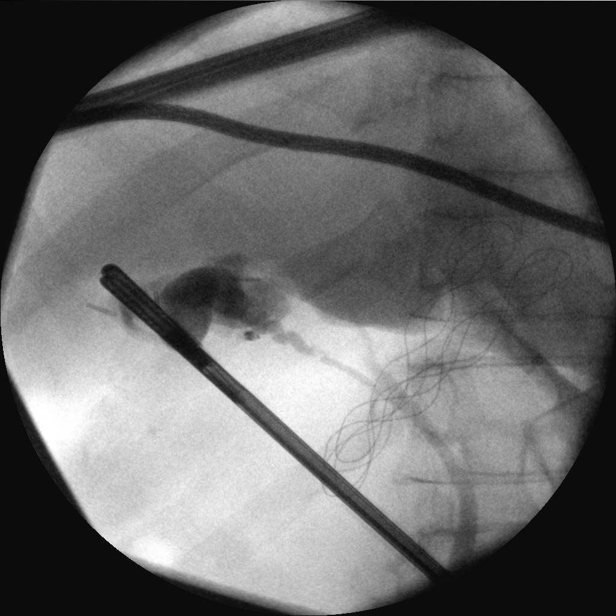
[frame 31/47]
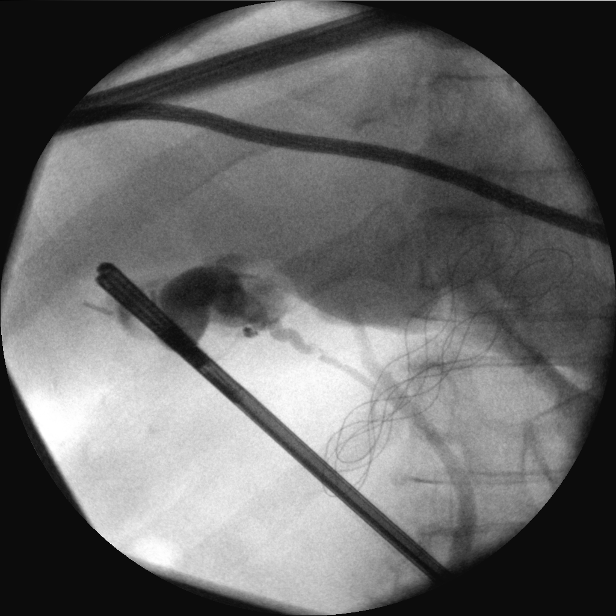
[frame 36/47]
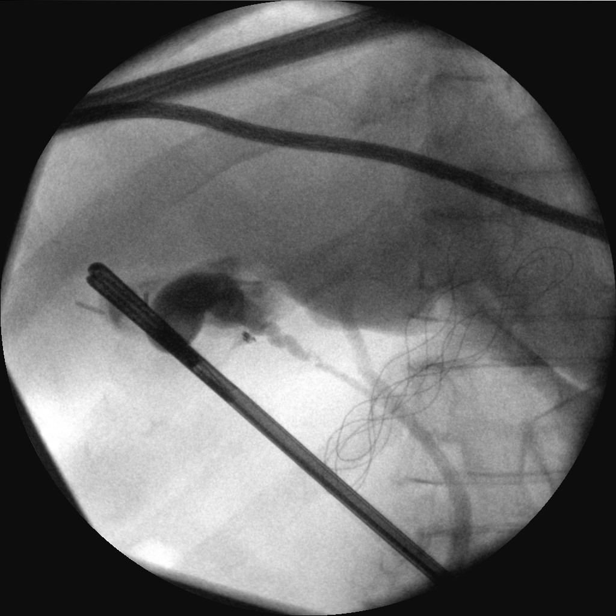
[frame 41/47]
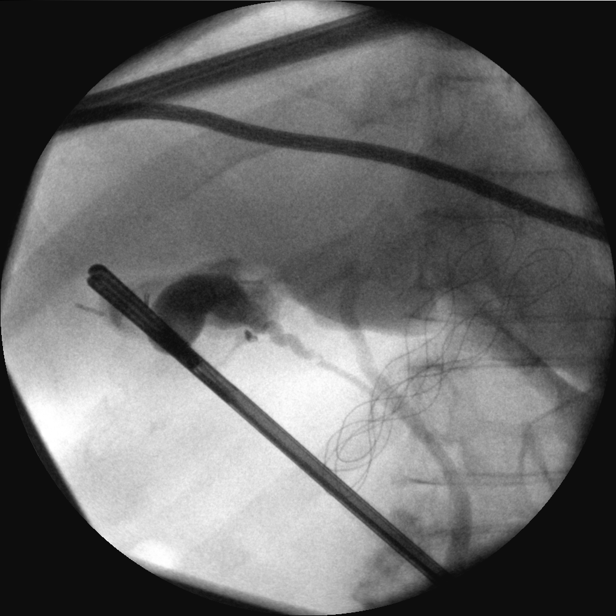
[frame 47/47]
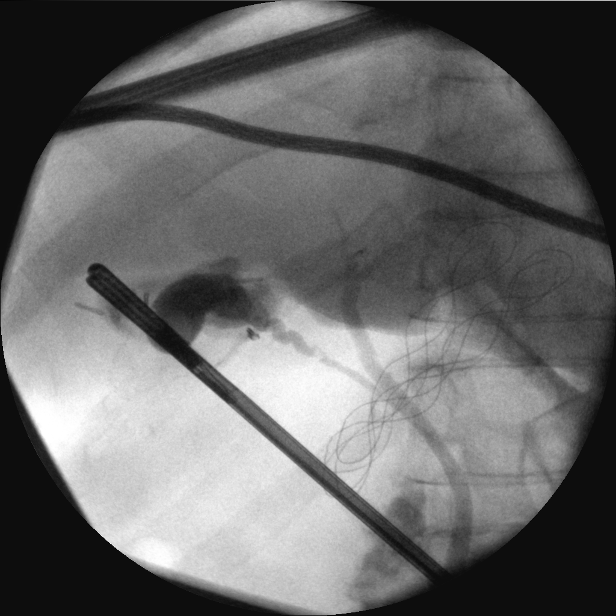

[11 of 11 positions shown; findings below may reference images not displayed]

FINDINGS: Surgical instruments project over the upper abdomen.

There is cannulation of the cystic duct/gallbladder neck, with
antegrade infusion of contrast. Incomplete visualization of the
extrahepatic biliary system.

Small filling defect within the mid cystic duct.

Contrast opacifies the duodenum

Ampulla not visualized.
IMPRESSION: Intraoperative cholangiogram demonstrates extrahepatic biliary ducts
of unremarkable caliber, with incomplete visualization of the
extrahepatic biliary system. The ampulla is not visualized, however,
contrast opacifies the duodenum.

Small filling defects within the mid cystic duct, potentially
stones/debris or air bubbles.

Please refer to the dictated operative report for full details of
intraoperative findings and procedure.

## 2016-10-25 ENCOUNTER — Ambulatory Visit (INDEPENDENT_AMBULATORY_CARE_PROVIDER_SITE_OTHER): Payer: Medicare HMO | Admitting: Family Medicine

## 2016-10-25 ENCOUNTER — Encounter: Payer: Self-pay | Admitting: *Deleted

## 2016-10-25 VITALS — BP 138/70 | HR 60 | Temp 97.0°F | Resp 16 | Ht 66.0 in | Wt 175.8 lb

## 2016-10-25 VITALS — BP 138/70 | HR 60 | Temp 97.0°F | Ht 65.5 in | Wt 175.5 lb

## 2016-10-25 DIAGNOSIS — I10 Essential (primary) hypertension: Secondary | ICD-10-CM | POA: Diagnosis not present

## 2016-10-25 DIAGNOSIS — I2510 Atherosclerotic heart disease of native coronary artery without angina pectoris: Secondary | ICD-10-CM | POA: Diagnosis not present

## 2016-10-25 DIAGNOSIS — Z1211 Encounter for screening for malignant neoplasm of colon: Secondary | ICD-10-CM | POA: Diagnosis not present

## 2016-10-25 DIAGNOSIS — Z0000 Encounter for general adult medical examination without abnormal findings: Secondary | ICD-10-CM

## 2016-10-25 DIAGNOSIS — I739 Peripheral vascular disease, unspecified: Secondary | ICD-10-CM | POA: Diagnosis not present

## 2016-10-25 NOTE — Patient Instructions (Addendum)
[name] , Thank you for taking time to come for your Medicare Wellness Visit. I appreciate your ongoing commitment to your health goals. Please review the following plan we discussed and let me know if I can assist you in the future.   These are the goals we discussed: Goals    . Increase water intake          Starting 10/25/16, I will increase my water intake to 4 glasses a day.       This is a list of the screening recommended for you and due dates:  Health Maintenance  Topic Date Due  . Flu Shot  06/29/2017*  . Shingles Vaccine  10/25/2026*  . Tetanus Vaccine  10/25/2026*  . Pneumonia vaccines (1 of 2 - PCV13) 10/25/2026*  . Colon Cancer Screening  09/05/2023  *Topic was postponed. The date shown is not the original due date.   Preventive Care for Adults  A healthy lifestyle and preventive care can promote health and wellness. Preventive health guidelines for adults include the following key practices.  . A routine yearly physical is a good way to check with your health care provider about your health and preventive screening. It is a chance to share any concerns and updates on your health and to receive a thorough exam.  . Visit your dentist for a routine exam and preventive care every 6 months. Brush your teeth twice a day and floss once a day. Good oral hygiene prevents tooth decay and gum disease.  . The frequency of eye exams is based on your age, health, family medical history, use  of contact lenses, and other factors. Follow your health care provider's ecommendations for frequency of eye exams.  . Eat a healthy diet. Foods like vegetables, fruits, whole grains, low-fat dairy products, and lean protein foods contain the nutrients you need without too many calories. Decrease your intake of foods high in solid fats, added sugars, and salt. Eat the right amount of calories for you. Get information about a proper diet from your health care provider, if necessary.  .  Regular physical exercise is one of the most important things you can do for your health. Most adults should get at least 150 minutes of moderate-intensity exercise (any activity that increases your heart rate and causes you to sweat) each week. In addition, most adults need muscle-strengthening exercises on 2 or more days a week.  Silver Sneakers may be a benefit available to you. To determine eligibility, you may visit the website: www.silversneakers.com or contact program at 618-712-0209 Mon-Fri between 8AM-8PM.   . Maintain a healthy weight. The body mass index (BMI) is a screening tool to identify possible weight problems. It provides an estimate of body fat based on height and weight. Your health care provider can find your BMI and can help you achieve or maintain a healthy weight.   For adults 20 years and older: ? A BMI below 18.5 is considered underweight. ? A BMI of 18.5 to 24.9 is normal. ? A BMI of 25 to 29.9 is considered overweight. ? A BMI of 30 and above is considered obese.   . Maintain normal blood lipids and cholesterol levels by exercising and minimizing your intake of saturated fat. Eat a balanced diet with plenty of fruit and vegetables. Blood tests for lipids and cholesterol should begin at age 25 and be repeated every 5 years. If your lipid or cholesterol levels are high, you are over 50, or you are at  high risk for heart disease, you may need your cholesterol levels checked more frequently. Ongoing high lipid and cholesterol levels should be treated with medicines if diet and exercise are not working.  . If you smoke, find out from your health care provider how to quit. If you do not use tobacco, please do not start.  . If you choose to drink alcohol, please do not consume more than 2 drinks per day. One drink is considered to be 12 ounces (355 mL) of beer, 5 ounces (148 mL) of wine, or 1.5 ounces (44 mL) of liquor.  . If you are 10-72 years old, ask your health care  provider if you should take aspirin to prevent strokes.  . Use sunscreen. Apply sunscreen liberally and repeatedly throughout the day. You should seek shade when your shadow is shorter than you. Protect yourself by wearing long sleeves, pants, a wide-brimmed hat, and sunglasses year round, whenever you are outdoors.  . Once a month, do a whole body skin exam, using a mirror to look at the skin on your back. Tell your health care provider of new moles, moles that have irregular borders, moles that are larger than a pencil eraser, or moles that have changed in shape or color.

## 2016-10-25 NOTE — Progress Notes (Signed)
Name: Robert Hansen   MRN: [id number]    DOB: August 31, 1941   Date:10/26/2016       Progress Note  Subjective  Chief Complaint  No chief complaint on file.   HPI  Pt. Presents for a complete physical exam. He is overall doing well.   Past Medical History:  Diagnosis Date  . Allergy   . Arthritis   . Cancer ([place])   . Cholelithiasis   . Chronic kidney disease    CREAT 1.9 3 YEARS AGO  . Coronary artery disease   . Diabetes mellitus without complication (HCC)    borderline  . GERD (gastroesophageal reflux disease)   . Heart murmur   . Hyperlipidemia   . Hypertension     Past Surgical History:  Procedure Laterality Date  . CHOLECYSTECTOMY N/A 04/19/2016   Procedure: LAPAROSCOPIC CHOLECYSTECTOMY;  Surgeon: Clayburn Pert, MD;  Location: [facility] ORS;  Service: General;  Laterality: N/A;  . CHOLECYSTECTOMY, LAPAROSCOPIC N/A 04/19/2016  . COLON SURGERY     Dr. Pat Patrick, [facility]  . CORONARY ANGIOPLASTY WITH STENT PLACEMENT     Dr. Humphrey Rolls, [facility]  . LAPAROSCOPIC CHOLECYSTECTOMY N/A    pt unsure of date / spring 2017    Family History  Problem Relation Age of Onset  . Diabetes Mother   . Cancer Mother     breast    Social History   Social History  . Marital status: Married    Spouse name: N/A  . Number of children: N/A  . Years of education: N/A   Occupational History  . Not on file.   Social History Main Topics  . Smoking status: Never Smoker  . Smokeless tobacco: Never Used  . Alcohol use 0.0 oz/week     Comment: occasional- liquor  . Drug use: No  . Sexual activity: Not on file   Other Topics Concern  . Not on file   Social History Narrative  . No narrative on file     Current Outpatient Prescriptions:  .  amLODipine (NORVASC) 10 MG tablet, Take 10 mg by mouth daily., Disp: , Rfl:  .  amLODipine (NORVASC) 5 MG tablet, Take 5 mg by mouth daily. , Disp: , Rfl:  .  aspirin 81 MG tablet, Take 81 mg by mouth at bedtime. , Disp: , Rfl:  .  cilostazol (PLETAL) 50  MG tablet, Take 50 mg by mouth 2 (two) times daily., Disp: , Rfl:  .  clopidogrel (PLAVIX) 75 MG tablet, Take 75 mg by mouth daily. , Disp: , Rfl:  .  hydrALAZINE (APRESOLINE) 100 MG tablet, Take 100 mg by mouth 2 (two) times daily. Reported on 12/16/2015, Disp: , Rfl:  .  hydrochlorothiazide (HYDRODIURIL) 12.5 MG tablet, Take 12.5 mg by mouth daily. , Disp: , Rfl:  .  metoprolol tartrate (LOPRESSOR) 25 MG tablet, Take 25 mg by mouth 2 (two) times daily. , Disp: , Rfl:  .  nitroGLYCERIN (NITROSTAT) 0.4 MG SL tablet, Place 0.4 mg under the tongue every 5 (five) minutes as needed. Reported on 04/19/2016, Disp: , Rfl:  .  Omega-3 Fatty Acids (FISH OIL) 1200 MG CPDR, Take 1 capsule by mouth 2 (two) times daily., Disp: , Rfl:  .  oxyCODONE-acetaminophen (ROXICET) 5-325 MG tablet, Take 1-2 tablets by mouth every 6 (six) hours as needed for severe pain. (Patient not taking: Reported on 10/25/2016), Disp: 30 tablet, Rfl: 0 .  rosuvastatin (CRESTOR) 20 MG tablet, Take 20 mg by mouth daily., Disp: , Rfl:  .  simvastatin (ZOCOR) 40 MG tablet, Take 1 tablet by mouth at bedtime., Disp: , Rfl:   Allergies  Allergen Reactions  . Codeine Nausea And Vomiting  . Morphine Nausea Only     Review of Systems  Constitutional: Negative for chills, fever and malaise/fatigue.  HENT: Negative for congestion, ear pain and sore throat.   Eyes: Negative for blurred vision and double vision.  Respiratory: Negative for cough, shortness of breath and wheezing.   Cardiovascular: Negative for chest pain.  Gastrointestinal: Negative for abdominal pain.  Genitourinary: Negative for dysuria, frequency and hematuria.  Musculoskeletal: Negative for back pain and myalgias.  Neurological: Negative for dizziness.  Psychiatric/Behavioral: Negative for depression. The patient is not nervous/anxious and does not have insomnia.     Objective  Vitals:   10/26/16 0900  BP: 138/70  Pulse: 60  Resp: 16  Temp: 97 F (36.1 C)   TempSrc: Oral  SpO2: 97%  Weight: 175 lb 12.8 oz (79.7 kg)  Height: 5\' 6"  (1.676 m)    Physical Exam  Constitutional: He is well-developed, well-nourished, and in no distress.  Cardiovascular: Normal rate, regular rhythm, S1 normal and S2 normal.   Murmur heard.  Systolic murmur is present with a grade of 2/6  Pulmonary/Chest: Breath sounds normal. He has no wheezes. He has no rhonchi.  Abdominal: Bowel sounds are normal. There is no tenderness.  Genitourinary:  Genitourinary Comments: Deferred per patient request  Musculoskeletal:       Right ankle: He exhibits no swelling.       Left ankle: He exhibits no swelling.  Psychiatric: Mood, memory, affect and judgment normal.  Nursing note and vitals reviewed.       Assessment & Plan  1. Medicare annual wellness visit, subsequent  - Lipid Profile - CBC with Differential - COMPLETE METABOLIC PANEL WITH GFR - PSA - Vitamin D (25 hydroxy) - TSH  2. Encounter for screening colonoscopy  - Ambulatory referral to Gastroenterology   Alena Blankenbeckler [facility] 10/26/2016 9:32 AM

## 2016-10-25 NOTE — Progress Notes (Signed)
Subjective:   Robert Hansen is a 75 y.o. male who presents for Medicare Annual/Subsequent preventive examination.  Review of Systems:  N/A  Cardiac Risk Factors include: advanced age (>38men, >65 women);dyslipidemia;hypertension;male gender     Objective:    Vitals: BP 138/70 (BP Location: Left Arm)   Pulse 60   Temp 97 F (36.1 C) (Oral)   Ht 5' 5.5" (1.664 m)   Wt 175 lb 8 oz (79.6 kg)   BMI 28.76 kg/m   Body mass index is 28.76 kg/m.  Tobacco History  Smoking Status  . Never Smoker  Smokeless Tobacco  . Never Used     Counseling given: Not Answered   Past Medical History:  Diagnosis Date  . Allergy   . Arthritis   . Cancer ([place])   . Cholelithiasis   . Chronic kidney disease    CREAT 1.9 3 YEARS AGO  . Coronary artery disease   . Diabetes mellitus without complication (HCC)    borderline  . GERD (gastroesophageal reflux disease)   . Heart murmur   . Hyperlipidemia   . Hypertension    Past Surgical History:  Procedure Laterality Date  . CHOLECYSTECTOMY N/A 04/19/2016   Procedure: LAPAROSCOPIC CHOLECYSTECTOMY;  Surgeon: Clayburn Pert, MD;  Location: [facility] ORS;  Service: General;  Laterality: N/A;  . CHOLECYSTECTOMY, LAPAROSCOPIC N/A 04/19/2016  . COLON SURGERY     Dr. Pat Patrick, [facility]  . CORONARY ANGIOPLASTY WITH STENT PLACEMENT     Dr. Humphrey Rolls, [facility]  . LAPAROSCOPIC CHOLECYSTECTOMY N/A    pt unsure of date / spring 2017   Family History  Problem Relation Age of Onset  . Diabetes Mother   . Cancer Mother     breast   History  Sexual Activity  . Sexual activity: Not on file    Outpatient Encounter Prescriptions as of 10/25/2016  Medication Sig  . amLODipine (NORVASC) 10 MG tablet Take 10 mg by mouth daily.  Marland Kitchen aspirin 81 MG tablet Take 81 mg by mouth at bedtime.   . cilostazol (PLETAL) 50 MG tablet Take 50 mg by mouth 2 (two) times daily.  . clopidogrel (PLAVIX) 75 MG tablet Take 75 mg by mouth daily.   . hydrALAZINE (APRESOLINE) 100 MG tablet Take  100 mg by mouth 2 (two) times daily. Reported on 12/16/2015  . hydrochlorothiazide (HYDRODIURIL) 12.5 MG tablet Take 12.5 mg by mouth daily.   . metoprolol tartrate (LOPRESSOR) 25 MG tablet Take 25 mg by mouth 2 (two) times daily.   . nitroGLYCERIN (NITROSTAT) 0.4 MG SL tablet Place 0.4 mg under the tongue every 5 (five) minutes as needed. Reported on 04/19/2016  . Omega-3 Fatty Acids (FISH OIL) 1200 MG CPDR Take 1 capsule by mouth 2 (two) times daily.  . rosuvastatin (CRESTOR) 20 MG tablet Take 20 mg by mouth daily.  Marland Kitchen amLODipine (NORVASC) 5 MG tablet Take 5 mg by mouth daily.   Marland Kitchen oxyCODONE-acetaminophen (ROXICET) 5-325 MG tablet Take 1-2 tablets by mouth every 6 (six) hours as needed for severe pain. (Patient not taking: Reported on 10/25/2016)  . simvastatin (ZOCOR) 40 MG tablet Take 1 tablet by mouth at bedtime.   No facility-administered encounter medications on file as of 10/25/2016.     Activities of Daily Living In your present state of health, do you have any difficulty performing the following activities: 10/25/2016 04/09/2016  Hearing? N N  Vision? N N  Difficulty concentrating or making decisions? N N  Walking or climbing stairs? N N  Dressing or bathing? N N  Doing errands, shopping? N -  Preparing Food and eating ? N -  Using the Toilet? N -  In the past six months, have you accidently leaked urine? N -  Do you have problems with loss of bowel control? N -  Managing your Medications? N -  Managing your Finances? N -  Housekeeping or managing your Housekeeping? N -  Some recent data might be hidden    Patient Care Team: Roselee Nova, MD as PCP - General (Family Medicine) Dionisio David, MD as Consulting Physician (Cardiology)   Assessment:     Exercise Activities and Dietary recommendations Current Exercise Habits: Home exercise routine, Type of exercise: walking, Time (Minutes): 40, Frequency (Times/Week): 3, Weekly Exercise (Minutes/Week): 120, Intensity:  Mild  Goals    . Increase water intake          Starting 10/25/16, I will increase my water intake to 4 glasses a day.      Fall Risk Fall Risk  10/25/2016 12/16/2015 12/03/2015 07/15/2015  Falls in the past year? No No No No   Depression Screen PHQ 2/9 Scores 10/25/2016 12/16/2015 12/03/2015 07/15/2015  PHQ - 2 Score 0 0 0 0    Cognitive Function     6CIT Screen 10/25/2016  What Year? 0 points  What month? 0 points  What time? 0 points  Count back from 20 2 points  Months in reverse 0 points  Repeat phrase 0 points  Total Score 2    There is no immunization history for the selected administration types on file for this patient. Screening Tests Health Maintenance  Topic Date Due  . INFLUENZA VACCINE  06/29/2017 (Originally 06/29/2016)  . ZOSTAVAX  10/25/2026 (Originally 03/13/2003)  . TETANUS/TDAP  10/25/2026 (Originally 03/12/1962)  . PNA vac Low Risk Adult (1 of 2 - PCV13) 10/25/2026 (Originally 03/12/2008)  . COLONOSCOPY  09/05/2023      Plan:  I have personally reviewed and addressed the Medicare Annual Wellness questionnaire and have noted the following in the patient's chart:  A. Medical and social history B. Use of alcohol, tobacco or illicit drugs  C. Current medications and supplements D. Functional ability and status E.  Nutritional status F.  Physical activity G. Advance directives H. List of other physicians I.  Hospitalizations, surgeries, and ER visits in previous 12 months J.  Ali Molina such as hearing and vision if needed, cognitive and depression L. Referrals and appointments - none  In addition, I have reviewed and discussed with patient certain preventive protocols, quality metrics, and best practice recommendations. A written personalized care plan for preventive services as well as general preventive health recommendations were provided to patient.  See attached scanned questionnaire for additional information.   Signed,  Fabio Neighbors, LPN Nurse Health Advisor  MD Recommendations: none.

## 2016-10-26 ENCOUNTER — Encounter: Payer: Self-pay | Admitting: Family Medicine

## 2016-11-03 DIAGNOSIS — Z0000 Encounter for general adult medical examination without abnormal findings: Secondary | ICD-10-CM | POA: Diagnosis not present

## 2016-11-03 DIAGNOSIS — E785 Hyperlipidemia, unspecified: Secondary | ICD-10-CM | POA: Diagnosis not present

## 2016-11-03 DIAGNOSIS — R972 Elevated prostate specific antigen [PSA]: Secondary | ICD-10-CM | POA: Diagnosis not present

## 2016-11-03 DIAGNOSIS — E559 Vitamin D deficiency, unspecified: Secondary | ICD-10-CM | POA: Diagnosis not present

## 2016-11-03 LAB — LIPID PANEL
CHOL/HDL RATIO: 2.9 ratio (ref <5.0)
CHOLESTEROL: 136 mg/dL (ref <200)
HDL: 47 mg/dL (ref >40)
LDL Cholesterol: 68 mg/dL (ref <100)
Triglycerides: 107 mg/dL (ref <150)
VLDL: 21 mg/dL (ref <30)

## 2016-11-03 LAB — CBC WITH DIFFERENTIAL/PLATELET
BASOS PCT: 0 %
Basophils Absolute: 0 cells/uL (ref 0–200)
EOS ABS: 144 {cells}/uL (ref 15–500)
Eosinophils Relative: 2 %
HEMATOCRIT: 40.7 % (ref 38.5–50.0)
Hemoglobin: 13.5 g/dL (ref 13.2–17.1)
LYMPHS PCT: 20 %
Lymphs Abs: 1440 cells/uL (ref 850–3900)
MCH: 31 pg (ref 27.0–33.0)
MCHC: 33.2 g/dL (ref 32.0–36.0)
MCV: 93.6 fL (ref 80.0–100.0)
MONO ABS: 720 {cells}/uL (ref 200–950)
MPV: 10.4 fL (ref 7.5–12.5)
Monocytes Relative: 10 %
NEUTROS ABS: 4896 {cells}/uL (ref 1500–7800)
Neutrophils Relative %: 68 %
PLATELETS: 163 10*3/uL (ref 140–400)
RBC: 4.35 MIL/uL (ref 4.20–5.80)
RDW: 14.1 % (ref 11.0–15.0)
WBC: 7.2 10*3/uL (ref 3.8–10.8)

## 2016-11-03 LAB — COMPLETE METABOLIC PANEL WITH GFR
ALT: 11 U/L (ref 9–46)
AST: 17 U/L (ref 10–35)
Albumin: 4.1 g/dL (ref 3.6–5.1)
Alkaline Phosphatase: 58 U/L (ref 40–115)
BILIRUBIN TOTAL: 0.6 mg/dL (ref 0.2–1.2)
BUN: 25 mg/dL (ref 7–25)
CALCIUM: 9 mg/dL (ref 8.6–10.3)
CHLORIDE: 103 mmol/L (ref 98–110)
CO2: 25 mmol/L (ref 20–31)
CREATININE: 1.94 mg/dL — AB (ref 0.70–1.18)
GFR, EST AFRICAN AMERICAN: 39 mL/min — AB (ref >=60)
GFR, Est Non African American: 33 mL/min — ABNORMAL LOW (ref >=60)
Glucose, Bld: 113 mg/dL — ABNORMAL HIGH (ref 65–99)
Potassium: 4 mmol/L (ref 3.5–5.3)
Sodium: 141 mmol/L (ref 135–146)
TOTAL PROTEIN: 6.4 g/dL (ref 6.1–8.1)

## 2016-11-03 LAB — TSH: TSH: 1.65 mIU/L (ref 0.40–4.50)

## 2016-11-03 LAB — PSA: PSA: 11.3 ng/mL — ABNORMAL HIGH (ref <=4.0)

## 2016-11-04 ENCOUNTER — Other Ambulatory Visit: Payer: Self-pay | Admitting: Family Medicine

## 2016-11-04 DIAGNOSIS — R972 Elevated prostate specific antigen [PSA]: Secondary | ICD-10-CM | POA: Insufficient documentation

## 2016-11-04 LAB — VITAMIN D 25 HYDROXY (VIT D DEFICIENCY, FRACTURES): Vit D, 25-Hydroxy: 41 ng/mL (ref 30–100)

## 2016-11-05 ENCOUNTER — Other Ambulatory Visit: Payer: Self-pay | Admitting: Family Medicine

## 2016-11-05 DIAGNOSIS — N183 Chronic kidney disease, stage 3 unspecified: Secondary | ICD-10-CM

## 2016-11-05 NOTE — Progress Notes (Unsigned)
Referrals to urology and nephrology have been placed

## 2016-11-08 ENCOUNTER — Encounter: Payer: Self-pay | Admitting: *Deleted

## 2016-11-11 ENCOUNTER — Ambulatory Visit (INDEPENDENT_AMBULATORY_CARE_PROVIDER_SITE_OTHER): Payer: Commercial Managed Care - HMO | Admitting: General Surgery

## 2016-11-11 ENCOUNTER — Encounter: Payer: Self-pay | Admitting: General Surgery

## 2016-11-11 VITALS — BP 136/70 | HR 60 | Resp 14 | Ht 66.0 in | Wt 177.0 lb

## 2016-11-11 DIAGNOSIS — Z85038 Personal history of other malignant neoplasm of large intestine: Secondary | ICD-10-CM | POA: Diagnosis not present

## 2016-11-11 MED ORDER — POLYETHYLENE GLYCOL 3350 17 GM/SCOOP PO POWD: ORAL | 0 refills | Status: DC

## 2016-11-11 NOTE — Patient Instructions (Addendum)
Stop Plavix for one week prior and hold pletal for 2 days, remains on aspirin.   Colonoscopy, Adult A colonoscopy is an exam to look at the entire large intestine. During the exam, a lubricated, bendable tube is inserted into the anus and then passed into the rectum, colon, and other parts of the large intestine. A colonoscopy is often done as a part of normal colorectal screening or in response to certain symptoms, such as anemia, persistent diarrhea, abdominal pain, and blood in the stool. The exam can help screen for and diagnose medical problems, including:  Tumors.  Polyps.  Inflammation.  Areas of bleeding. Tell a health care provider about:  Any allergies you have.  All medicines you are taking, including vitamins, herbs, eye drops, creams, and over-the-counter medicines.  Any problems you or family members have had with anesthetic medicines.  Any blood disorders you have.  Any surgeries you have had.  Any medical conditions you have.  Any problems you have had passing stool. What are the risks? Generally, this is a safe procedure. However, problems may occur, including:  Bleeding.  A tear in the intestine.  A reaction to medicines given during the exam.  Infection (rare). What happens before the procedure? Eating and drinking restrictions  Follow instructions from your health care provider about eating and drinking, which may include:  A few days before the procedure - follow a low-fiber diet. Avoid nuts, seeds, dried fruit, raw fruits, and vegetables.  1-3 days before the procedure - follow a clear liquid diet. Drink only clear liquids, such as clear broth or bouillon, black coffee or tea, clear juice, clear soft drinks or sports drinks, gelatin desert, and popsicles. Avoid any liquids that contain red or purple dye.  On the day of the procedure - do not eat or drink anything during the 2 hours before the procedure, or within the time period that your health  care provider recommends. Bowel prep  If you were prescribed an oral bowel prep to clean out your colon:  Take it as told by your health care provider. Starting the day before your procedure, you will need to drink a large amount of medicated liquid. The liquid will cause you to have multiple loose stools until your stool is almost clear or light green.  If your skin or anus gets irritated from diarrhea, you may use these to relieve the irritation:  Medicated wipes, such as adult wet wipes with aloe and vitamin E.  A skin soothing-product like petroleum jelly.  If you vomit while drinking the bowel prep, take a break for up to 60 minutes and then begin the bowel prep again. If vomiting continues and you cannot take the bowel prep without vomiting, call your health care provider. General instructions  Ask your health care provider about changing or stopping your regular medicines. This is especially important if you are taking diabetes medicines or blood thinners.  Plan to have someone take you home from the hospital or clinic. What happens during the procedure?  An IV tube may be inserted into one of your veins.  You will be given medicine to help you relax (sedative).  To reduce your risk of infection:  Your health care team will wash or sanitize their hands.  Your anal area will be washed with soap.  You will be asked to lie on your side with your knees bent.  Your health care provider will lubricate a long, thin, flexible tube. The tube will have a  camera and a light on the end.  The tube will be inserted into your anus.  The tube will be gently eased through your rectum and colon.  Air will be delivered into your colon to keep it open. You may feel some pressure or cramping.  The camera will be used to take images during the procedure.  A small tissue sample may be removed from your body to be examined under a microscope (biopsy). If any potential problems are found, the  tissue will be sent to a lab for testing.  If small polyps are found, your health care provider may remove them and have them checked for cancer cells.  The tube that was inserted into your anus will be slowly removed. The procedure may vary among health care providers and hospitals. What happens after the procedure?  Your blood pressure, heart rate, breathing rate, and blood oxygen level will be monitored until the medicines you were given have worn off.  Do not drive for 24 hours after the exam.  You may have a small amount of blood in your stool.  You may pass gas and have mild abdominal cramping or bloating due to the air that was used to inflate your colon during the exam.  It is up to you to get the results of your procedure. Ask your health care provider, or the department performing the procedure, when your results will be ready. This information is not intended to replace advice given to you by your health care provider. Make sure you discuss any questions you have with your health care provider. Document Released: 11/12/2000 Document Revised: 06/04/2016 Document Reviewed: 01/27/2016 Elsevier Interactive Patient Education  2017 [employer].

## 2016-11-11 NOTE — Progress Notes (Signed)
Patient ID: Robert Hansen, male   DOB: [birth year], 75 y.o.   MRN: [id number]  Chief Complaint  Patient presents with  . Colonoscopy    HPI Robert Hansen is a 75 y.o. male.  Who presents for a colonoscopy discussion. The last colonoscopy was completed on 2012. He states he had 3 polyps. He has a known history of colon cancer in 2011 with resection and chemotherapy.  Denies any gastrointestinal issues. Bowels move regular and one spot of bleeding noted only with straining.  HPI  Past Medical History:  Diagnosis Date  . Allergy   . Arthritis   . Cancer ([place]) 2011   colon, SIGMOID COLON RESECTION: INVASIVE ADENOCARCINOMA  . Cholelithiasis   . Chronic kidney disease    CREAT 1.9 3 YEARS AGO  . Colon polyp 11/02/2011   tubular adenoma  . Coronary artery disease   . Diabetes mellitus without complication (HCC)    borderline  . GERD (gastroesophageal reflux disease)   . Heart murmur   . Hyperlipidemia   . Hypertension     Past Surgical History:  Procedure Laterality Date  . CHOLECYSTECTOMY N/A 04/19/2016   Procedure: LAPAROSCOPIC CHOLECYSTECTOMY;  Surgeon: Clayburn Pert, MD;  Location: [facility] ORS;  Service: General;  Laterality: N/A;  . COLON SURGERY  07/15/2010   Dr. Pat Patrick, [facility], SIGMOID COLON RESECTION:   . COLONOSCOPY  11/02/2011   Dr Pat Patrick  . CORONARY ANGIOPLASTY WITH STENT PLACEMENT  2000   Dr. Humphrey Rolls, [facility]    Family History  Problem Relation Age of Onset  . Diabetes Mother   . Cancer Mother     breast    Social History Social History  Substance Use Topics  . Smoking status: Never Smoker  . Smokeless tobacco: Never Used  . Alcohol use 0.0 oz/week     Comment: occasional- liquor    Allergies  Allergen Reactions  . Codeine Nausea And Vomiting  . Morphine Nausea Only    Current Outpatient Prescriptions  Medication Sig Dispense Refill  . amLODipine (NORVASC) 10 MG tablet Take 10 mg by mouth daily.    Marland Kitchen aspirin 81 MG tablet Take 81 mg by mouth at bedtime.      . cilostazol (PLETAL) 50 MG tablet Take 50 mg by mouth 2 (two) times daily.    . clopidogrel (PLAVIX) 75 MG tablet Take 75 mg by mouth daily.     . hydrALAZINE (APRESOLINE) 100 MG tablet Take 100 mg by mouth 2 (two) times daily. Reported on 12/16/2015    . hydrochlorothiazide (HYDRODIURIL) 12.5 MG tablet Take 12.5 mg by mouth daily.     . metoprolol tartrate (LOPRESSOR) 25 MG tablet Take 25 mg by mouth 2 (two) times daily.     . nitroGLYCERIN (NITROSTAT) 0.4 MG SL tablet Place 0.4 mg under the tongue every 5 (five) minutes as needed. Reported on 04/19/2016    . Omega-3 Fatty Acids (FISH OIL) 1200 MG CPDR Take 1 capsule by mouth 2 (two) times daily.    . rosuvastatin (CRESTOR) 20 MG tablet Take 20 mg by mouth daily.    . polyethylene glycol powder (GLYCOLAX/MIRALAX) powder 255 grams one bottle for colonoscopy prep 255 g 0   No current facility-administered medications for this visit.     Review of Systems Review of Systems  Constitutional: Negative.   Respiratory: Negative.   Cardiovascular: Negative.   Gastrointestinal: Negative for constipation, diarrhea and rectal pain.    Blood pressure 136/70, pulse 60, resp. rate 14, height 5\' 6"  (  1.676 m), weight 177 lb (80.3 kg).  Physical Exam Physical Exam  Constitutional: He is oriented to person, place, and time. He appears well-developed and well-nourished.  HENT:  Mouth/Throat: Oropharynx is clear and moist.  Eyes: Conjunctivae are normal. No scleral icterus.  Neck: Neck supple.  Cardiovascular: Normal rate, regular rhythm and normal heart sounds.   Pulmonary/Chest: Effort normal and breath sounds normal.  Abdominal: Soft. Normal appearance. There is no tenderness. No hernia. Hernia confirmed negative in the right inguinal area and confirmed negative in the left inguinal area.    Prior abdominal incisions clean.  Lymphadenopathy:    He has no cervical adenopathy.  Neurological: He is alert and oriented to person, place, and time.   Skin: Skin is warm and dry.  Psychiatric: His behavior is normal.    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 T2, into (4/11 nodes positive).  Colonoscopy biopsy dated 11/03/2011 showed a tubular adenoma in the mid transverse colon, distal transverse colon 2.  Assessment    Candidate for follow-up colonoscopy.  Ongoing antiplatelet therapy. No recent stent placement.     Plan        Colonoscopy with possible biopsy/polypectomy prn: Information regarding the procedure, including its potential risks and complications (including but not limited to perforation of the bowel, which may require emergency surgery to repair, and bleeding) was verbally given to the patient. Educational information regarding lower intestinal endoscopy was given to the patient. Written instructions for how to complete the bowel prep using Miralax were provided. The importance of drinking ample fluids to avoid dehydration as a result of the prep emphasized.  Patient has been scheduled for a colonoscopy on 12-21-16 at [facility]. He has been asked to stop Plavix for one week prior and hold pletal for 2 days prior, remains on aspirin.  This information has been scribed by Karie Fetch RN, BSN,BC.  Robert Hansen 11/12/2016, 6:32 PM

## 2016-12-07 ENCOUNTER — Ambulatory Visit: Payer: Medicare HMO

## 2016-12-20 ENCOUNTER — Telehealth: Payer: Self-pay | Admitting: *Deleted

## 2016-12-20 NOTE — Telephone Encounter (Signed)
Patient stopped by the office to report that has only been off his Plavix for 3 days and has not stopped his Pletal. Colonoscopy was originally scheduled for tomorrow, 12-21-16.   Colonoscopy has been rescheduled for 01-11-17 at [facility].  This patient was reminded to only take his usual morning blood pressure pills the morning of procedure with a small sip of water. He verbalizes understanding.   Patient instructed to call the office should he have further questions.

## 2017-01-03 ENCOUNTER — Telehealth: Payer: Self-pay | Admitting: *Deleted

## 2017-01-03 NOTE — Telephone Encounter (Signed)
Patient was contacted today and confirms no medication changes since his last office visit.   This patient reports that he has picked up Miralax prescription.  We will proceed with colonoscopy as scheduled for Tuesday, 01-11-17 at [facility].   He was reminded about stopping Plavix and Pletal. This patient verbalizes understanding. Patient will only take blood pressure pills the morning of procedure.  Patient was encouraged to call the office should he have further questions.

## 2017-01-10 ENCOUNTER — Encounter: Payer: Self-pay | Admitting: *Deleted

## 2017-01-11 ENCOUNTER — Ambulatory Visit: Payer: Medicare HMO | Admitting: Anesthesiology

## 2017-01-11 ENCOUNTER — Encounter
Admission: RE | Disposition: A | Discharge status: Home / Self Care | Payer: Self-pay | Source: Ambulatory Visit | Attending: General Surgery

## 2017-01-11 ENCOUNTER — Ambulatory Visit
Admission: RE | Admit: 2017-01-11 | Discharge: 2017-01-11 | Disposition: A | Discharge status: Home / Self Care | Payer: Medicare HMO | Source: Ambulatory Visit | Attending: General Surgery | Admitting: General Surgery

## 2017-01-11 DIAGNOSIS — I129 Hypertensive chronic kidney disease with stage 1 through stage 4 chronic kidney disease, or unspecified chronic kidney disease: Secondary | ICD-10-CM | POA: Insufficient documentation

## 2017-01-11 DIAGNOSIS — D130 Benign neoplasm of esophagus: Secondary | ICD-10-CM | POA: Diagnosis not present

## 2017-01-11 DIAGNOSIS — D123 Benign neoplasm of transverse colon: Secondary | ICD-10-CM | POA: Diagnosis not present

## 2017-01-11 DIAGNOSIS — N189 Chronic kidney disease, unspecified: Secondary | ICD-10-CM | POA: Diagnosis not present

## 2017-01-11 DIAGNOSIS — Z85038 Personal history of other malignant neoplasm of large intestine: Secondary | ICD-10-CM | POA: Diagnosis not present

## 2017-01-11 DIAGNOSIS — Z955 Presence of coronary angioplasty implant and graft: Secondary | ICD-10-CM | POA: Diagnosis not present

## 2017-01-11 DIAGNOSIS — Z86010 Personal history of colonic polyps: Secondary | ICD-10-CM | POA: Diagnosis not present

## 2017-01-11 DIAGNOSIS — D128 Benign neoplasm of rectum: Secondary | ICD-10-CM | POA: Insufficient documentation

## 2017-01-11 DIAGNOSIS — D120 Benign neoplasm of cecum: Secondary | ICD-10-CM | POA: Diagnosis not present

## 2017-01-11 DIAGNOSIS — Z79899 Other long term (current) drug therapy: Secondary | ICD-10-CM | POA: Insufficient documentation

## 2017-01-11 DIAGNOSIS — Z7982 Long term (current) use of aspirin: Secondary | ICD-10-CM | POA: Insufficient documentation

## 2017-01-11 DIAGNOSIS — Z1211 Encounter for screening for malignant neoplasm of colon: Secondary | ICD-10-CM | POA: Diagnosis not present

## 2017-01-11 DIAGNOSIS — K635 Polyp of colon: Secondary | ICD-10-CM | POA: Diagnosis not present

## 2017-01-11 DIAGNOSIS — D124 Benign neoplasm of descending colon: Secondary | ICD-10-CM | POA: Insufficient documentation

## 2017-01-11 DIAGNOSIS — Z7902 Long term (current) use of antithrombotics/antiplatelets: Secondary | ICD-10-CM | POA: Diagnosis not present

## 2017-01-11 DIAGNOSIS — E1122 Type 2 diabetes mellitus with diabetic chronic kidney disease: Secondary | ICD-10-CM | POA: Insufficient documentation

## 2017-01-11 DIAGNOSIS — I2510 Atherosclerotic heart disease of native coronary artery without angina pectoris: Secondary | ICD-10-CM | POA: Insufficient documentation

## 2017-01-11 HISTORY — PX: COLONOSCOPY WITH PROPOFOL: SHX5780

## 2017-01-11 LAB — HM COLONOSCOPY

## 2017-01-11 SURGERY — COLONOSCOPY WITH PROPOFOL
Anesthesia: General

## 2017-01-11 MED ORDER — PROPOFOL 10 MG/ML IV BOLUS
INTRAVENOUS | Status: DC | PRN
  Administered 2017-01-11: 40 mg via INTRAVENOUS
  Administered 2017-01-11: 20 mg via INTRAVENOUS

## 2017-01-11 MED ORDER — LIDOCAINE HCL 2 % EX GEL
CUTANEOUS | Status: AC
  Filled 2017-01-11: qty 5

## 2017-01-11 MED ORDER — MIDAZOLAM HCL 2 MG/2ML IJ SOLN
INTRAMUSCULAR | Status: DC | PRN
  Administered 2017-01-11: 2 mg via INTRAVENOUS

## 2017-01-11 MED ORDER — EPHEDRINE SULFATE 50 MG/ML IJ SOLN
INTRAMUSCULAR | Status: DC | PRN
  Administered 2017-01-11 (×2): 5 mg via INTRAVENOUS

## 2017-01-11 MED ORDER — MIDAZOLAM HCL 2 MG/2ML IJ SOLN
INTRAMUSCULAR | Status: AC
  Filled 2017-01-11: qty 2

## 2017-01-11 MED ORDER — PROPOFOL 500 MG/50ML IV EMUL
INTRAVENOUS | Status: DC | PRN
  Administered 2017-01-11: 140 ug/kg/min via INTRAVENOUS

## 2017-01-11 MED ORDER — PROPOFOL 500 MG/50ML IV EMUL
INTRAVENOUS | Status: AC
  Filled 2017-01-11: qty 50

## 2017-01-11 MED ORDER — SODIUM CHLORIDE 0.9 % IV SOLN
INTRAVENOUS | Status: DC
  Administered 2017-01-11: 13:00:00 via INTRAVENOUS

## 2017-01-11 MED ORDER — LIDOCAINE HCL (CARDIAC) 20 MG/ML IV SOLN
INTRAVENOUS | Status: DC | PRN
  Administered 2017-01-11: 50 mg via INTRAVENOUS

## 2017-01-11 NOTE — Transfer of Care (Signed)
Immediate Anesthesia Transfer of Care Note  Patient: Robert Hansen  Procedure(s) Performed: Procedure(s): COLONOSCOPY WITH PROPOFOL (N/A)  Patient Location: PACU  Anesthesia Type:General  Level of Consciousness: sedated  Airway & Oxygen Therapy: Patient Spontanous Breathing and Patient connected to nasal cannula oxygen  Post-op Assessment: Report given to RN and Post -op Vital signs reviewed and stable  Post vital signs: Reviewed and stable  Last Vitals:  Vitals:   01/11/17 1242 01/11/17 1400  BP: (!) 160/76 (!) 87/41  Pulse: (!) 58 61  Resp: 18 19  Temp: (!) 36 C 36.4 C    Last Pain:  Vitals:   01/11/17 1400  TempSrc: Tympanic         Complications: No apparent anesthesia complications

## 2017-01-11 NOTE — Anesthesia Postprocedure Evaluation (Signed)
Anesthesia Post Note  Patient: Robert Hansen  Procedure(s) Performed: Procedure(s) (LRB): COLONOSCOPY WITH PROPOFOL (N/A)  Patient location during evaluation: PACU Anesthesia Type: General Level of consciousness: awake Pain management: satisfactory to patient Respiratory status: spontaneous breathing Cardiovascular status: stable Anesthetic complications: no     Last Vitals:  Vitals:   01/11/17 1420 01/11/17 1430  BP: 115/73 132/76  Pulse: 63 (!) 56  Resp: (!) 23 20  Temp:      Last Pain:  Vitals:   01/11/17 1400  TempSrc: Tympanic                 VAN STAVEREN,Hodan Wurtz

## 2017-01-11 NOTE — Anesthesia Preprocedure Evaluation (Signed)
Anesthesia Evaluation  Patient identified by MRN, date of birth, ID band Patient awake    Reviewed: Allergy & Precautions, NPO status , Patient's Chart, lab work & pertinent test results  Airway Mallampati: III       Dental  (+) Upper Dentures, Lower Dentures   Pulmonary neg pulmonary ROS,    breath sounds clear to auscultation       Cardiovascular Exercise Tolerance: Good hypertension, Pt. on home beta blockers + CAD and + Cardiac Stents   Rhythm:Regular Rate:Normal     Neuro/Psych negative neurological ROS  negative psych ROS   GI/Hepatic Neg liver ROS, GERD  Medicated,  Endo/Other  diabetes, Type 2  Renal/GU      Musculoskeletal   Abdominal   Peds negative pediatric ROS (+)  Hematology   Anesthesia Other Findings   Reproductive/Obstetrics                             Anesthesia Physical Anesthesia Plan  ASA: III  Anesthesia Plan: General   Post-op Pain Management:    Induction: Intravenous  Airway Management Planned: Natural Airway and Nasal Cannula  Additional Equipment:   Intra-op Plan:   Post-operative Plan:   Informed Consent: I have reviewed the patients History and Physical, chart, labs and discussed the procedure including the risks, benefits and alternatives for the proposed anesthesia with the patient or authorized representative who has indicated his/her understanding and acceptance.     Plan Discussed with: CRNA and Surgeon  Anesthesia Plan Comments:         Anesthesia Quick Evaluation

## 2017-01-11 NOTE — H&P (Signed)
Robert Hansen [id number] Apr 22, 1941     HPI: Sigmoid colon cancer in 2011. Polyps in the transverse colon in 2012.  For ollow up exam.   Prescriptions Prior to Admission  Medication Sig Dispense Refill Last Dose  . amLODipine (NORVASC) 10 MG tablet Take 10 mg by mouth daily.   01/11/2017 at Unknown time  . aspirin 81 MG tablet Take 81 mg by mouth at bedtime.    01/10/2017 at Unknown time  . cilostazol (PLETAL) 50 MG tablet Take 50 mg by mouth 2 (two) times daily.   01/11/2017 at Unknown time  . clopidogrel (PLAVIX) 75 MG tablet Take 75 mg by mouth daily.    Past Week at Unknown time  . hydrALAZINE (APRESOLINE) 100 MG tablet Take 100 mg by mouth 2 (two) times daily. Reported on 12/16/2015   01/11/2017 at Unknown time  . metoprolol tartrate (LOPRESSOR) 25 MG tablet Take 25 mg by mouth 2 (two) times daily.    01/11/2017 at Unknown time  . polyethylene glycol powder (GLYCOLAX/MIRALAX) powder 255 grams one bottle for colonoscopy prep 255 g 0 01/10/2017 at Unknown time  . hydrochlorothiazide (HYDRODIURIL) 12.5 MG tablet Take 12.5 mg by mouth daily.    Not Taking at Unknown time  . nitroGLYCERIN (NITROSTAT) 0.4 MG SL tablet Place 0.4 mg under the tongue every 5 (five) minutes as needed. Reported on 04/19/2016   Not Taking at Unknown time  . Omega-3 Fatty Acids (FISH OIL) 1200 MG CPDR Take 1 capsule by mouth 2 (two) times daily.   Not Taking at Unknown time  . rosuvastatin (CRESTOR) 20 MG tablet Take 20 mg by mouth daily.   Not Taking at Unknown time   Allergies  Allergen Reactions  . Codeine Nausea And Vomiting  . Morphine Nausea Only   Past Medical History:  Diagnosis Date  . Allergy   . Arthritis   . Cancer ([place]) 2011   colon, SIGMOID COLON RESECTION: INVASIVE ADENOCARCINOMA  . Cholelithiasis   . Chronic kidney disease    CREAT 1.9 3 YEARS AGO  . Colon polyp 11/02/2011   tubular adenoma  . Coronary artery disease   . Diabetes mellitus without complication (HCC)    borderline  . GERD  (gastroesophageal reflux disease)   . Heart murmur   . Hyperlipidemia   . Hypertension    Past Surgical History:  Procedure Laterality Date  . CHOLECYSTECTOMY N/A 04/19/2016   Procedure: LAPAROSCOPIC CHOLECYSTECTOMY;  Surgeon: Clayburn Pert, MD;  Location: [facility] ORS;  Service: General;  Laterality: N/A;  . COLON SURGERY  07/15/2010   Dr. Pat Patrick, [facility], SIGMOID COLON RESECTION:   . COLONOSCOPY  11/02/2011   Dr Pat Patrick  . CORONARY ANGIOPLASTY WITH STENT PLACEMENT  2000   Dr. Humphrey Rolls, [facility]   Social History   Social History  . Marital status: Married    Spouse name: N/A  . Number of children: N/A  . Years of education: N/A   Occupational History  . Not on file.   Social History Main Topics  . Smoking status: Never Smoker  . Smokeless tobacco: Never Used  . Alcohol use 0.0 oz/week     Comment: occasional- liquor  . Drug use: No  . Sexual activity: Not on file   Other Topics Concern  . Not on file   Social History Narrative  . No narrative on file   Social History   Social History Narrative  . No narrative on file     ROS: Negative.  PE: HEENT: Negative. Lungs: Clear. Cardio: RR.  Assessment/Plan:  Proceed with planned endoscopy.   Robert Bellow 01/11/2017   Assessment/Plan:  Proceed with planned endoscopy.

## 2017-01-11 NOTE — Anesthesia Post-op Follow-up Note (Cosign Needed)
Anesthesia QCDR form completed.        

## 2017-01-11 NOTE — Op Note (Signed)
[facility] Patient Name: Robert Hansen Procedure Date: 01/11/2017 1:06 PM MRN: [id number] Account #: [id number] Date of Birth: 1941-07-22 Admit Type: Outpatient Age: 76 Room: [facility] ENDO ROOM 1 Gender: Male Note Status: Finalized Procedure:            Colonoscopy Indications:          High risk colon cancer surveillance: Personal history                        of colonic polyps, High risk colon cancer surveillance:                        Personal history of colon cancer Providers:            Robert Bellow, MD Referring MD:         Otila Back. Manuella Ghazi (Referring MD) Medicines:            Monitored Anesthesia Care Complications:        No immediate complications. Procedure:            Pre-Anesthesia Assessment:                       - Prior to the procedure, a History and Physical was                        performed, and patient medications, allergies and                        sensitivities were reviewed. The patient's tolerance of                        previous anesthesia was reviewed.                       - The risks and benefits of the procedure and the                        sedation options and risks were discussed with the                        patient. All questions were answered and informed                        consent was obtained.                       After obtaining informed consent, the colonoscope was                        passed under direct vision. Throughout the procedure,                        the patient's blood pressure, pulse, and oxygen                        saturations were monitored continuously. The                        colonoscopy was performed without difficulty. The  patient tolerated the procedure well. The quality of                        the bowel preparation was excellent. The Colonoscope                        was introduced through the anus and advanced to the the       terminal ileum. Findings:      Three sessile polyps were found in the cecum. The polyps were 7 to 14 mm       in size. These polyps were removed with a hot snare. Resection and       retrieval were complete.      A 10 mm polyp was found in the transverse colon. The polyp was sessile.       The polyp was removed with a hot snare. Resection and retrieval were       complete.      A 5 mm polyp was found in the descending colon. The polyp was sessile.       The polyp was removed with a hot snare. Resection and retrieval were       complete.      A 15 mm polyp was found in the recto-sigmoid colon. The polyp was       sessile. The polyp was removed with a hot snare. Resection and retrieval       were complete.      The retroflexed view of the distal rectum and anal verge was normal and       showed no anal or rectal abnormalities. Impression:           - Three 7 to 14 mm polyps in the cecum, removed with a                        hot snare. Resected and retrieved.                       - One 10 mm polyp in the transverse colon, removed with                        a hot snare. Resected and retrieved.                       - One 5 mm polyp in the descending colon, removed with                        a hot snare. Resected and retrieved.                       - One 15 mm polyp at the recto-sigmoid colon, removed                        with a hot snare. Resected and retrieved.                       - The distal rectum and anal verge are normal on                        retroflexion view. Recommendation:       - Repeat colonoscopy in 3 years for surveillance.                       -  Telephone endoscopist for pathology results in 1 week.                       - DO NOT RESTART [place], FEBRUARY 18th. Diagnosis Code(s):    --- Professional ---                       Z86.010, Personal history of colonic polyps                       Z85.038, Personal history of other malignant neoplasm                         of large intestine                       D12.0, Benign neoplasm of cecum                       D12.3, Benign neoplasm of transverse colon (hepatic                        flexure or splenic flexure)                       D12.4, Benign neoplasm of descending colon                       D12.7, Benign neoplasm of rectosigmoid junction Robert Bellow, MD 01/11/2017 2:01:58 PM This report has been signed electronically. Number of Addenda: 0 Note Initiated On: 01/11/2017 1:06 PM Scope Withdrawal Time: 0 hours 32 minutes 3 seconds  Total Procedure Duration: 0 hours 35 minutes 22 seconds       [facility]

## 2017-01-11 NOTE — Anesthesia Procedure Notes (Signed)
Date/Time: 01/11/2017 1:15 PM Performed by: Johnna Acosta Pre-anesthesia Checklist: Patient identified, Emergency Drugs available, Suction available, Patient being monitored and Timeout performed Patient Re-evaluated:Patient Re-evaluated prior to inductionOxygen Delivery Method: Nasal cannula

## 2017-01-12 ENCOUNTER — Encounter: Payer: Self-pay | Admitting: General Surgery

## 2017-01-13 LAB — SURGICAL PATHOLOGY

## 2017-01-14 ENCOUNTER — Telehealth: Payer: Self-pay

## 2017-01-14 NOTE — Telephone Encounter (Signed)
-----   Message from Robert Bellow, MD sent at 01/14/2017  9:23 AM EST ----- Please notify the patient that all polyps were benign. We need to do this exam again in 3 years. Thank you ----- Message ----- From: Interface, Lab In Three Zero One Sent: 01/13/2017   7:01 PM To: Robert Bellow, MD

## 2017-01-14 NOTE — Telephone Encounter (Signed)
Notified patient as instructed, patient pleased. Discussed follow-up appointments, patient agrees. Patient placed in recalls.   

## 2017-01-17 ENCOUNTER — Encounter: Payer: Self-pay | Admitting: Emergency Medicine

## 2017-01-25 ENCOUNTER — Ambulatory Visit: Payer: Medicare HMO | Admitting: Family Medicine

## 2017-01-25 DIAGNOSIS — K210 Gastro-esophageal reflux disease with esophagitis: Secondary | ICD-10-CM | POA: Diagnosis not present

## 2017-01-25 DIAGNOSIS — I10 Essential (primary) hypertension: Secondary | ICD-10-CM | POA: Diagnosis not present

## 2017-01-25 DIAGNOSIS — I2510 Atherosclerotic heart disease of native coronary artery without angina pectoris: Secondary | ICD-10-CM | POA: Diagnosis not present

## 2017-01-25 DIAGNOSIS — R0602 Shortness of breath: Secondary | ICD-10-CM | POA: Diagnosis not present

## 2017-01-25 DIAGNOSIS — I219 Acute myocardial infarction, unspecified: Secondary | ICD-10-CM | POA: Diagnosis not present

## 2017-02-02 ENCOUNTER — Ambulatory Visit: Payer: Medicare HMO | Admitting: Family Medicine

## 2017-03-17 DIAGNOSIS — I10 Essential (primary) hypertension: Secondary | ICD-10-CM | POA: Diagnosis not present

## 2017-03-17 DIAGNOSIS — K210 Gastro-esophageal reflux disease with esophagitis: Secondary | ICD-10-CM | POA: Diagnosis not present

## 2017-03-17 DIAGNOSIS — I2510 Atherosclerotic heart disease of native coronary artery without angina pectoris: Secondary | ICD-10-CM | POA: Diagnosis not present

## 2017-03-17 DIAGNOSIS — I351 Nonrheumatic aortic (valve) insufficiency: Secondary | ICD-10-CM | POA: Diagnosis not present

## 2017-03-17 DIAGNOSIS — I340 Nonrheumatic mitral (valve) insufficiency: Secondary | ICD-10-CM | POA: Diagnosis not present

## 2017-03-17 DIAGNOSIS — R0602 Shortness of breath: Secondary | ICD-10-CM | POA: Diagnosis not present

## 2017-07-14 DIAGNOSIS — K210 Gastro-esophageal reflux disease with esophagitis: Secondary | ICD-10-CM | POA: Diagnosis not present

## 2017-07-14 DIAGNOSIS — I10 Essential (primary) hypertension: Secondary | ICD-10-CM | POA: Diagnosis not present

## 2017-07-14 DIAGNOSIS — Z9861 Coronary angioplasty status: Secondary | ICD-10-CM | POA: Diagnosis not present

## 2017-07-14 DIAGNOSIS — I2510 Atherosclerotic heart disease of native coronary artery without angina pectoris: Secondary | ICD-10-CM | POA: Diagnosis not present

## 2017-07-14 DIAGNOSIS — I252 Old myocardial infarction: Secondary | ICD-10-CM | POA: Diagnosis not present

## 2017-07-14 DIAGNOSIS — I340 Nonrheumatic mitral (valve) insufficiency: Secondary | ICD-10-CM | POA: Diagnosis not present

## 2017-08-22 DIAGNOSIS — I2510 Atherosclerotic heart disease of native coronary artery without angina pectoris: Secondary | ICD-10-CM | POA: Diagnosis not present

## 2017-08-22 DIAGNOSIS — I340 Nonrheumatic mitral (valve) insufficiency: Secondary | ICD-10-CM | POA: Diagnosis not present

## 2017-08-22 DIAGNOSIS — K210 Gastro-esophageal reflux disease with esophagitis: Secondary | ICD-10-CM | POA: Diagnosis not present

## 2017-08-22 DIAGNOSIS — I10 Essential (primary) hypertension: Secondary | ICD-10-CM | POA: Diagnosis not present

## 2017-09-21 DIAGNOSIS — I10 Essential (primary) hypertension: Secondary | ICD-10-CM | POA: Diagnosis not present

## 2017-09-21 DIAGNOSIS — E785 Hyperlipidemia, unspecified: Secondary | ICD-10-CM | POA: Diagnosis not present

## 2017-09-21 DIAGNOSIS — I340 Nonrheumatic mitral (valve) insufficiency: Secondary | ICD-10-CM | POA: Diagnosis not present

## 2017-09-21 DIAGNOSIS — I2510 Atherosclerotic heart disease of native coronary artery without angina pectoris: Secondary | ICD-10-CM | POA: Diagnosis not present

## 2017-09-21 DIAGNOSIS — K210 Gastro-esophageal reflux disease with esophagitis: Secondary | ICD-10-CM | POA: Diagnosis not present

## 2017-09-27 ENCOUNTER — Encounter: Payer: Self-pay | Admitting: Family Medicine

## 2017-09-27 ENCOUNTER — Ambulatory Visit (INDEPENDENT_AMBULATORY_CARE_PROVIDER_SITE_OTHER): Payer: Medicare HMO | Admitting: Family Medicine

## 2017-09-27 ENCOUNTER — Ambulatory Visit (INDEPENDENT_AMBULATORY_CARE_PROVIDER_SITE_OTHER): Payer: Medicare HMO

## 2017-09-27 VITALS — BP 108/60 | HR 60 | Temp 97.6°F | Resp 14 | Ht 64.0 in | Wt 169.0 lb

## 2017-09-27 VITALS — BP 108/60 | HR 60 | Temp 97.6°F | Resp 16 | Ht 66.0 in | Wt 169.4 lb

## 2017-09-27 DIAGNOSIS — Z0000 Encounter for general adult medical examination without abnormal findings: Secondary | ICD-10-CM

## 2017-09-27 DIAGNOSIS — N183 Chronic kidney disease, stage 3 unspecified: Secondary | ICD-10-CM

## 2017-09-27 NOTE — Patient Instructions (Signed)
[name] , Thank you for taking time to come for your Medicare Wellness Visit. I appreciate your ongoing commitment to your health goals. Please review the following plan we discussed and let me know if I can assist you in the future.   Screening recommendations/referrals: Colonoscopy: Completed 01/11/17. Due to repeat colonoscopy in 3 years Recommended yearly ophthalmology/optometry visit for glaucoma screening and checkup Recommended yearly dental visit for hygiene and checkup  Vaccinations: Influenza vaccine: Declined Pneumococcal vaccine: Declined Tdap vaccine: Declined. Please call your insurance company to determine your out of pocket expense. Shingles vaccine: Declined. Please call your insurance company to determine your out of pocket expense.    Advanced directives: Advance directive discussed with you today. Even though you declined this today please call our office should you change your mind and we can give you the proper paperwork for you to fill out.  Conditions/risks identified: Fall risk prevention discussed  Next appointment: Scheduled to see Dr. Manuella Ghazi on 09/27/17 @ 2:40pm. Follow up with the Nurse Health Advisor in one year for your annual wellness exam.  Preventive Care 65 Years and Older, Male Preventive care refers to lifestyle choices and visits with your health care provider that can promote health and wellness. What does preventive care include?  A yearly physical exam. This is also called an annual well check.  Dental exams once or twice a year.  Routine eye exams. Ask your health care provider how often you should have your eyes checked.  Personal lifestyle choices, including:  Daily care of your teeth and gums.  Regular physical activity.  Eating a healthy diet.  Avoiding tobacco and drug use.  Limiting alcohol use.  Practicing safe sex.  Taking low doses of aspirin every day.  Taking vitamin and mineral supplements as recommended by your  health care provider. What happens during an annual well check? The services and screenings done by your health care provider during your annual well check will depend on your age, overall health, lifestyle risk factors, and family history of disease. Counseling  Your health care provider may ask you questions about your:  Alcohol use.  Tobacco use.  Drug use.  Emotional well-being.  Home and relationship well-being.  Sexual activity.  Eating habits.  History of falls.  Memory and ability to understand (cognition).  Work and work Statistician. Screening  You may have the following tests or measurements:  Height, weight, and BMI.  Blood pressure.  Lipid and cholesterol levels. These may be checked every 5 years, or more frequently if you are over 74 years old.  Skin check.  Lung cancer screening. You may have this screening every year starting at age 53 if you have a 30-pack-year history of smoking and currently smoke or have quit within the past 15 years.  Fecal occult blood test (FOBT) of the stool. You may have this test every year starting at age 52.  Flexible sigmoidoscopy or colonoscopy. You may have a sigmoidoscopy every 5 years or a colonoscopy every 10 years starting at age 37.  Prostate cancer screening. Recommendations will vary depending on your family history and other risks.  Hepatitis C blood test.  Hepatitis B blood test.  Sexually transmitted disease (STD) testing.  Diabetes screening. This is done by checking your blood sugar (glucose) after you have not eaten for a while (fasting). You may have this done every 1-3 years.  Abdominal aortic aneurysm (AAA) screening. You may need this if you are a current or former smoker.  Osteoporosis. You may be screened starting at age 56 if you are at high risk. Talk with your health care provider about your test results, treatment options, and if necessary, the need for more tests. Vaccines  Your health  care provider may recommend certain vaccines, such as:  Influenza vaccine. This is recommended every year.  Tetanus, diphtheria, and acellular pertussis (Tdap, Td) vaccine. You may need a Td booster every 10 years.  Zoster vaccine. You may need this after age 6.  Pneumococcal 13-valent conjugate (PCV13) vaccine. One dose is recommended after age 25.  Pneumococcal polysaccharide (PPSV23) vaccine. One dose is recommended after age 13. Talk to your health care provider about which screenings and vaccines you need and how often you need them. This information is not intended to replace advice given to you by your health care provider. Make sure you discuss any questions you have with your health care provider. Document Released: 12/12/2015 Document Revised: 08/04/2016 Document Reviewed: 09/16/2015 Elsevier Interactive Patient Education  2017 [employer] Prevention in the Home Falls can cause injuries. They can happen to people of all ages. There are many things you can do to make your home safe and to help prevent falls. What can I do on the outside of my home?  Regularly fix the edges of walkways and driveways and fix any cracks.  Remove anything that might make you trip as you walk through a door, such as a raised step or threshold.  Trim any bushes or trees on the path to your home.  Use bright outdoor lighting.  Clear any walking paths of anything that might make someone trip, such as rocks or tools.  Regularly check to see if handrails are loose or broken. Make sure that both sides of any steps have handrails.  Any raised decks and porches should have guardrails on the edges.  Have any leaves, snow, or ice cleared regularly.  Use sand or salt on walking paths during winter.  Clean up any spills in your garage right away. This includes oil or grease spills. What can I do in the bathroom?  Use night lights.  Install grab bars by the toilet and in the tub and  shower. Do not use towel bars as grab bars.  Use non-skid mats or decals in the tub or shower.  If you need to sit down in the shower, use a plastic, non-slip stool.  Keep the floor dry. Clean up any water that spills on the floor as soon as it happens.  Remove soap buildup in the tub or shower regularly.  Attach bath mats securely with double-sided non-slip rug tape.  Do not have throw rugs and other things on the floor that can make you trip. What can I do in the bedroom?  Use night lights.  Make sure that you have a light by your bed that is easy to reach.  Do not use any sheets or blankets that are too big for your bed. They should not hang down onto the floor.  Have a firm chair that has side arms. You can use this for support while you get dressed.  Do not have throw rugs and other things on the floor that can make you trip. What can I do in the kitchen?  Clean up any spills right away.  Avoid walking on wet floors.  Keep items that you use a lot in easy-to-reach places.  If you need to reach something above you, use a strong step  stool that has a grab bar.  Keep electrical cords out of the way.  Do not use floor polish or wax that makes floors slippery. If you must use wax, use non-skid floor wax.  Do not have throw rugs and other things on the floor that can make you trip. What can I do with my stairs?  Do not leave any items on the stairs.  Make sure that there are handrails on both sides of the stairs and use them. Fix handrails that are broken or loose. Make sure that handrails are as long as the stairways.  Check any carpeting to make sure that it is firmly attached to the stairs. Fix any carpet that is loose or worn.  Avoid having throw rugs at the top or bottom of the stairs. If you do have throw rugs, attach them to the floor with carpet tape.  Make sure that you have a light switch at the top of the stairs and the bottom of the stairs. If you do not  have them, ask someone to add them for you. What else can I do to help prevent falls?  Wear shoes that:  Do not have high heels.  Have rubber bottoms.  Are comfortable and fit you well.  Are closed at the toe. Do not wear sandals.  If you use a stepladder:  Make sure that it is fully opened. Do not climb a closed stepladder.  Make sure that both sides of the stepladder are locked into place.  Ask someone to hold it for you, if possible.  Clearly mark and make sure that you can see:  Any grab bars or handrails.  First and last steps.  Where the edge of each step is.  Use tools that help you move around (mobility aids) if they are needed. These include:  Canes.  Walkers.  Scooters.  Crutches.  Turn on the lights when you go into a dark area. Replace any light bulbs as soon as they burn out.  Set up your furniture so you have a clear path. Avoid moving your furniture around.  If any of your floors are uneven, fix them.  If there are any pets around you, be aware of where they are.  Review your medicines with your doctor. Some medicines can make you feel dizzy. This can increase your chance of falling. Ask your doctor what other things that you can do to help prevent falls. This information is not intended to replace advice given to you by your health care provider. Make sure you discuss any questions you have with your health care provider. Document Released: 09/11/2009 Document Revised: 04/22/2016 Document Reviewed: 12/20/2014 Elsevier Interactive Patient Education  2017 [employer].

## 2017-09-27 NOTE — Progress Notes (Signed)
Subjective:   Robert Hansen is a 76 y.o. male who presents for Medicare Annual/Subsequent preventive examination.  Review of Systems:  N/A       Objective:    Vitals: BP 108/60 (BP Location: Left Arm, Patient Position: Sitting, Cuff Size: Normal)   Pulse 60   Temp 97.6 F (36.4 C) (Oral)   Resp 16   Ht 5\' 6"  (1.676 m)   Wt 169 lb 6.4 oz (76.8 kg)   BMI 27.34 kg/m   Body mass index is 27.34 kg/m.  Tobacco History  Smoking Status  . Never Smoker  Smokeless Tobacco  . Never Used     Counseling given: Not Answered   Past Medical History:  Diagnosis Date  . Allergy   . Arthritis   . Cancer ([place]) 2011   colon, SIGMOID COLON RESECTION: INVASIVE ADENOCARCINOMA  . Cholelithiasis   . Chronic kidney disease    CREAT 1.9 3 YEARS AGO  . Colon polyp 11/02/2011   tubular adenoma  . Coronary artery disease   . Diabetes mellitus without complication (HCC)    borderline  . GERD (gastroesophageal reflux disease)   . Heart murmur   . Hyperlipidemia   . Hypertension    Past Surgical History:  Procedure Laterality Date  . CHOLECYSTECTOMY N/A 04/19/2016   Procedure: LAPAROSCOPIC CHOLECYSTECTOMY;  Surgeon: Clayburn Pert, MD;  Location: [facility] ORS;  Service: General;  Laterality: N/A;  . COLON SURGERY  07/15/2010   Dr. Pat Patrick, [facility], SIGMOID COLON RESECTION:   . COLONOSCOPY  11/02/2011   Dr Pat Patrick  . COLONOSCOPY WITH PROPOFOL N/A 01/11/2017   Procedure: COLONOSCOPY WITH PROPOFOL;  Surgeon: Robert Bellow, MD;  Location: [facility] ENDOSCOPY;  Service: Endoscopy;  Laterality: N/A;  . CORONARY ANGIOPLASTY WITH STENT PLACEMENT  2000   Dr. Humphrey Rolls, [facility]   Family History  Problem Relation Age of Onset  . Diabetes Mother   . Cancer Mother        breast   History  Sexual Activity  . Sexual activity: Not on file    Outpatient Encounter Prescriptions as of 09/27/2017  Medication Sig  . amLODipine (NORVASC) 10 MG tablet Take 10 mg by mouth daily.  Marland Kitchen aspirin 81 MG tablet Take 81 mg by  mouth at bedtime.   . cilostazol (PLETAL) 50 MG tablet Take 50 mg by mouth 2 (two) times daily.  . hydrALAZINE (APRESOLINE) 100 MG tablet Take 100 mg by mouth 2 (two) times daily. Reported on 12/16/2015  . hydrochlorothiazide (HYDRODIURIL) 12.5 MG tablet Take 12.5 mg by mouth daily.   . metoprolol tartrate (LOPRESSOR) 25 MG tablet Take 25 mg by mouth 2 (two) times daily.   . nitroGLYCERIN (NITROSTAT) 0.4 MG SL tablet Place 0.4 mg under the tongue every 5 (five) minutes as needed. Reported on 04/19/2016  . Omega-3 Fatty Acids (FISH OIL) 1200 MG CPDR Take 1 capsule by mouth 2 (two) times daily.  . rosuvastatin (CRESTOR) 20 MG tablet Take 20 mg by mouth daily.   No facility-administered encounter medications on file as of 09/27/2017.     Activities of Daily Living In your present state of health, do you have any difficulty performing the following activities: 09/27/2017 10/25/2016  Hearing? N N  Vision? Y N  Comment farsighted -  Difficulty concentrating or making decisions? N N  Walking or climbing stairs? N N  Dressing or bathing? N N  Doing errands, shopping? N N  Preparing Food and eating ? N N  Using the Toilet?  N N  In the past six months, have you accidently leaked urine? N N  Do you have problems with loss of bowel control? N N  Managing your Medications? N N  Managing your Finances? N N  Housekeeping or managing your Housekeeping? N N  Some recent data might be hidden    Patient Care Team: Roselee Nova, MD as PCP - General (Family Medicine) Dionisio David, MD as Consulting Physician (Cardiology)   Assessment:     Exercise Activities and Dietary recommendations    Goals    . Increase water intake          Starting 10/25/16, I will increase my water intake to 4 glasses a day.    . Increase water intake          Recommend to increase fluid intake to 4-6 glasses per day      Fall Risk Fall Risk  09/27/2017 10/25/2016 12/16/2015 12/03/2015 07/15/2015  Falls in  the past year? No No No No No   Depression Screen PHQ 2/9 Scores 09/27/2017 10/25/2016 12/16/2015 12/03/2015  PHQ - 2 Score 0 0 0 0    Cognitive Function: declined     6CIT Screen 10/25/2016  What Year? 0 points  What month? 0 points  What time? 0 points  Count back from 20 2 points  Months in reverse 0 points  Repeat phrase 0 points  Total Score 2    There is no immunization history for the selected administration types on file for this patient. Screening Tests Health Maintenance  Topic Date Due  . INFLUENZA VACCINE  06/29/2017  . TETANUS/TDAP  10/25/2026 (Originally 03/12/1960)  . PNA vac Low Risk Adult (1 of 2 - PCV13) 10/25/2026 (Originally 03/12/2008)  . COLONOSCOPY  01/12/2020      Plan:   I have personally reviewed and addressed the Medicare Annual Wellness questionnaire and have noted the following in the patient's chart:  A. Medical and social history B. Use of alcohol, tobacco or illicit drugs  C. Current medications and supplements D. Functional ability and status E.  Nutritional status F.  Physical activity G. Advance directives and Code Status: Advance directive discussed with pt today. Pt advised to call our office if he should change his mind. We will be happy to provide him with the proper paperwork for him to complete. Pt given MOST and DNR paperwork today for completion. H. List of other physicians I.  Hospitalizations, surgeries, and ER visits in previous 12 months J.  Friend such as hearing and vision if needed, cognitive and depression L. Referrals and appointments - none  In addition, I have reviewed and discussed with patient certain preventive protocols, quality metrics, and best practice recommendations. A written personalized care plan for preventive services as well as general preventive health recommendations were provided to patient.  See attached scanned questionnaire for additional information.   Signed,  Aleatha Borer,  LPN Nurse Health Advisor   Recommendations for Immunizations per CDC guidelines:  Vaccinations: Influenza vaccine: Declined. Does not intend to receive this vaccine now or anytime in the near future Pneumococcal vaccine: Declined. Does not intend to receive these vaccines now or anytime in the near future Tdap vaccine: Declined. Advised pt to check with insurance re: his out pocket expense. Advised he amy also receive this vaccine at his local pharmacy or Health Dept. Shingles vaccine: Declined. Advised pt to check with insurance re: his out pocket expense. Advised he amy also receive  this vaccine at his local pharmacy or Health Dept.   Recommendations for Health Maintenance Screenings:  Screenings: Colonoscopy: Completed 01/11/17. Due to repeat colonoscopy in 3 years Recommended yearly ophthalmology/optometry visit for glaucoma screening and checkup Recommended yearly dental visit for hygiene and checkup

## 2017-09-27 NOTE — Progress Notes (Signed)
Name: Robert Hansen   MRN: [id number]    DOB: [birth year]   Date:09/27/2017       Progress Note  Subjective  Chief Complaint  Chief Complaint  Patient presents with  . Referral    Kidney doctor  . Follow-up    2nd part of medicare wellness    HPI  Pt. Requests referral to Nephrology, he sees heart specialist Dr. Humphrey Rolls who obtained lab work and noted abnormality in kidney function who recommended that he should be referred to Nephrology. Upon review of his lab work from December 2017, it was found that he had elevated Creatinine and low GFR, he will be referred to Nephrology for chronic kidney disease.    Past Medical History:  Diagnosis Date  . Allergy   . Arthritis   . Cancer ([place]) 2011   colon, SIGMOID COLON RESECTION: INVASIVE ADENOCARCINOMA  . Cholelithiasis   . Chronic kidney disease    CREAT 1.9 3 YEARS AGO  . Colon polyp 11/02/2011   tubular adenoma  . Coronary artery disease   . Diabetes mellitus without complication (HCC)    borderline  . GERD (gastroesophageal reflux disease)   . Heart murmur   . Hyperlipidemia   . Hypertension     Past Surgical History:  Procedure Laterality Date  . CHOLECYSTECTOMY N/A 04/19/2016   Procedure: LAPAROSCOPIC CHOLECYSTECTOMY;  Surgeon: Clayburn Pert, MD;  Location: [facility] ORS;  Service: General;  Laterality: N/A;  . COLON SURGERY  07/15/2010   Dr. Pat Patrick, [facility], SIGMOID COLON RESECTION:   . COLONOSCOPY  11/02/2011   Dr Pat Patrick  . COLONOSCOPY WITH PROPOFOL N/A 01/11/2017   Procedure: COLONOSCOPY WITH PROPOFOL;  Surgeon: Robert Bellow, MD;  Location: [place] ENDOSCOPY;  Service: Endoscopy;  Laterality: N/A;  . CORONARY ANGIOPLASTY WITH STENT PLACEMENT  2000   Dr. Humphrey Rolls, [facility]    Family History  Problem Relation Age of Onset  . Diabetes Mother   . Cancer Mother        breast    Social History   Social History  . Marital status: Married    Spouse name: N/A  . Number of children: N/A  . Years of education: N/A    Occupational History  . Not on file.   Social History Main Topics  . Smoking status: Never Smoker  . Smokeless tobacco: Never Used  . Alcohol use 0.0 oz/week     Comment: occasional- liquor  . Drug use: No  . Sexual activity: Not on file   Other Topics Concern  . Not on file   Social History Narrative  . No narrative on file     Current Outpatient Prescriptions:  .  amLODipine (NORVASC) 10 MG tablet, Take 10 mg by mouth daily., Disp: , Rfl:  .  aspirin 81 MG tablet, Take 81 mg by mouth at bedtime. , Disp: , Rfl:  .  cilostazol (PLETAL) 50 MG tablet, Take 50 mg by mouth 2 (two) times daily., Disp: , Rfl:  .  hydrALAZINE (APRESOLINE) 100 MG tablet, Take 100 mg by mouth 2 (two) times daily. Reported on 12/16/2015, Disp: , Rfl:  .  hydrochlorothiazide (HYDRODIURIL) 12.5 MG tablet, Take 12.5 mg by mouth daily. , Disp: , Rfl:  .  metoprolol tartrate (LOPRESSOR) 25 MG tablet, Take 25 mg by mouth 2 (two) times daily. , Disp: , Rfl:  .  nitroGLYCERIN (NITROSTAT) 0.4 MG SL tablet, Place 0.4 mg under the tongue every 5 (five) minutes as needed. Reported on 04/19/2016, Disp: ,  Rfl:  .  Omega-3 Fatty Acids (FISH OIL) 1200 MG CPDR, Take 1 capsule by mouth 2 (two) times daily., Disp: , Rfl:  .  rosuvastatin (CRESTOR) 20 MG tablet, Take 20 mg by mouth daily., Disp: , Rfl:   Allergies  Allergen Reactions  . Codeine Nausea And Vomiting  . Morphine Nausea Only     ROS  Please see HPI for complete discussion of ROS  Objective  Vitals:   09/27/17 1415  BP: 108/60  Pulse: 60  Resp: 14  Temp: 97.6 F (36.4 C)  TempSrc: Oral  SpO2: 98%  Weight: 169 lb (76.7 kg)  Height: 5\' 4"  (1.626 m)    Physical Exam  Constitutional: He is oriented to person, place, and time and well-developed, well-nourished, and in no distress.  Cardiovascular: Normal rate, regular rhythm, S1 normal and S2 normal.   Murmur heard. Pulmonary/Chest: Effort normal and breath sounds normal. No respiratory  distress.  Neurological: He is alert and oriented to person, place, and time.  Psychiatric: Mood, memory, affect and judgment normal.  Nursing note and vitals reviewed.      Assessment & Plan  1. Stage 3 chronic kidney disease [facility]) Referral to Nephrology for management of CKD. - Ambulatory referral to Nephrology   Bexley Mclester Asad A. [facility] 09/27/2017 2:34 PM

## 2017-11-15 ENCOUNTER — Encounter: Payer: Medicare HMO | Admitting: Family Medicine

## 2017-11-23 ENCOUNTER — Ambulatory Visit (INDEPENDENT_AMBULATORY_CARE_PROVIDER_SITE_OTHER): Payer: Medicare HMO | Admitting: Family Medicine

## 2017-11-23 ENCOUNTER — Encounter: Payer: Self-pay | Admitting: Family Medicine

## 2017-11-23 VITALS — BP 134/66 | HR 94 | Temp 97.7°F | Resp 18 | Ht 65.25 in | Wt 173.6 lb

## 2017-11-23 DIAGNOSIS — Z0001 Encounter for general adult medical examination with abnormal findings: Secondary | ICD-10-CM | POA: Diagnosis not present

## 2017-11-23 DIAGNOSIS — E786 Lipoprotein deficiency: Secondary | ICD-10-CM | POA: Diagnosis not present

## 2017-11-23 DIAGNOSIS — J3489 Other specified disorders of nose and nasal sinuses: Secondary | ICD-10-CM | POA: Diagnosis not present

## 2017-11-23 DIAGNOSIS — Z0000 Encounter for general adult medical examination without abnormal findings: Secondary | ICD-10-CM | POA: Diagnosis not present

## 2017-11-23 DIAGNOSIS — E781 Pure hyperglyceridemia: Secondary | ICD-10-CM | POA: Diagnosis not present

## 2017-11-23 DIAGNOSIS — R972 Elevated prostate specific antigen [PSA]: Secondary | ICD-10-CM | POA: Diagnosis not present

## 2017-11-23 DIAGNOSIS — Z23 Encounter for immunization: Secondary | ICD-10-CM

## 2017-11-23 DIAGNOSIS — R202 Paresthesia of skin: Secondary | ICD-10-CM | POA: Diagnosis not present

## 2017-11-23 NOTE — Progress Notes (Signed)
Name: Robert Hansen   MRN: [id number]    DOB: 08/28/1941   Date:11/23/2017       Progress Note  Subjective  Chief Complaint  Chief Complaint  Patient presents with  . Annual Exam    HPI  Pt. Presents for Annual Physical Exam.  He screening Colonoscopy was February 2018, repeat in 2021. He is due for prostate cancer screening. His last PSA in December 2017 was 11.3 and was referred to urology but he reports not receiving any communication from the specialist and he never followed up He declines tetanus and shingles vaccines.    Past Medical History:  Diagnosis Date  . Allergy   . Arthritis   . Cancer ([place]) 2011   colon, SIGMOID COLON RESECTION: INVASIVE ADENOCARCINOMA  . Cholelithiasis   . Chronic kidney disease    CREAT 1.9 3 YEARS AGO  . Colon polyp 11/02/2011   tubular adenoma  . Coronary artery disease   . Diabetes mellitus without complication (HCC)    borderline  . GERD (gastroesophageal reflux disease)   . Heart murmur   . Hyperlipidemia   . Hypertension     Past Surgical History:  Procedure Laterality Date  . CHOLECYSTECTOMY N/A 04/19/2016   Procedure: LAPAROSCOPIC CHOLECYSTECTOMY;  Surgeon: Clayburn Pert, MD;  Location: [facility] ORS;  Service: General;  Laterality: N/A;  . COLON SURGERY  07/15/2010   Dr. Pat Patrick, [facility], SIGMOID COLON RESECTION:   . COLONOSCOPY  11/02/2011   Dr Pat Patrick  . COLONOSCOPY WITH PROPOFOL N/A 01/11/2017   Procedure: COLONOSCOPY WITH PROPOFOL;  Surgeon: Robert Bellow, MD;  Location: [facility] ENDOSCOPY;  Service: Endoscopy;  Laterality: N/A;  . CORONARY ANGIOPLASTY WITH STENT PLACEMENT  2000   Dr. Humphrey Rolls, [facility]    Family History  Problem Relation Age of Onset  . Diabetes Mother   . Breast cancer Mother        breast    Social History   Socioeconomic History  . Marital status: Married    Spouse name: Not on file  . Number of children: Not on file  . Years of education: Not on file  . Highest education level: Not on file  Social Needs   . Financial resource strain: Not on file  . Food insecurity - worry: Not on file  . Food insecurity - inability: Not on file  . Transportation needs - medical: Not on file  . Transportation needs - non-medical: Not on file  Occupational History  . Not on file  Tobacco Use  . Smoking status: Never Smoker  . Smokeless tobacco: Never Used  Substance and Sexual Activity  . Alcohol use: No    Alcohol/week: 0.0 oz    Frequency: Never  . Drug use: No  . Sexual activity: Not Currently  Other Topics Concern  . Not on file  Social History Narrative  . Not on file     Current Outpatient Medications:  .  amLODipine (NORVASC) 10 MG tablet, Take 10 mg by mouth daily., Disp: , Rfl:  .  aspirin 81 MG tablet, Take 81 mg by mouth at bedtime. , Disp: , Rfl:  .  clopidogrel (PLAVIX) 75 MG tablet, Take 75 mg by mouth daily., Disp: , Rfl:  .  hydrALAZINE (APRESOLINE) 100 MG tablet, Take 100 mg by mouth 2 (two) times daily. Reported on 12/16/2015, Disp: , Rfl:  .  losartan-hydrochlorothiazide (HYZAAR) 50-12.5 MG tablet, , Disp: , Rfl:  .  metoprolol tartrate (LOPRESSOR) 25 MG tablet, Take 25 mg by  mouth 2 (two) times daily. , Disp: , Rfl:  .  nitroGLYCERIN (NITROSTAT) 0.4 MG SL tablet, Place 0.4 mg under the tongue every 5 (five) minutes as needed. Reported on 04/19/2016, Disp: , Rfl:  .  Omega-3 Fatty Acids (FISH OIL) 1200 MG CPDR, Take 1 capsule by mouth 2 (two) times daily., Disp: , Rfl:  .  cilostazol (PLETAL) 50 MG tablet, Take 50 mg by mouth 2 (two) times daily., Disp: , Rfl:  .  hydrochlorothiazide (HYDRODIURIL) 12.5 MG tablet, Take 12.5 mg by mouth daily. , Disp: , Rfl:   Allergies  Allergen Reactions  . Codeine Nausea And Vomiting  . Morphine Nausea Only     Review of Systems  Constitutional: Negative for chills, fever and malaise/fatigue.  HENT: Positive for sinus pain (chronic sinus pressure). Negative for congestion, ear pain, nosebleeds and sore throat.   Eyes: Negative for blurred  vision and double vision.  Respiratory: Negative for cough, sputum production, shortness of breath and stridor.   Cardiovascular: Negative for chest pain, palpitations and leg swelling.  Gastrointestinal: Negative for abdominal pain, blood in stool, constipation, diarrhea, nausea and vomiting.  Genitourinary: Negative for dysuria and hematuria.  Musculoskeletal: Negative for back pain and neck pain.  Skin: Negative for itching and rash.  Neurological: Positive for tingling (bilateral feet tingling). Negative for dizziness (occasional light-headedness when he tries to get up and go fast) and headaches.  Psychiatric/Behavioral: Negative for depression. The patient is not nervous/anxious and does not have insomnia.     Objective  Vitals:   11/23/17 1131  BP: 134/66  Pulse: 94  Resp: 18  Temp: 97.7 F (36.5 C)  TempSrc: Oral  SpO2: 99%  Weight: 173 lb 9.6 oz (78.7 kg)  Height: 5' 5.25" (1.657 m)    Physical Exam  Constitutional: He is oriented to person, place, and time and well-developed, well-nourished, and in no distress.  HENT:  Head: Normocephalic and atraumatic.  Right Ear: External ear normal.  Left Ear: External ear normal.  Mouth/Throat: Oropharynx is clear and moist. No oropharyngeal exudate.  Eyes: Conjunctivae are normal. Pupils are equal, round, and reactive to light.  Neck: Neck supple.  Cardiovascular: Normal rate, regular rhythm and normal heart sounds.  No murmur heard. Pulmonary/Chest: Effort normal and breath sounds normal. He has no wheezes.  Abdominal: Soft. Bowel sounds are normal. There is no tenderness.  Genitourinary: Prostate is enlarged. Prostate is not tender.  Musculoskeletal: He exhibits no edema.  Neurological: He is alert and oriented to person, place, and time.  Psychiatric: Mood, memory, affect and judgment normal.  Nursing note and vitals reviewed.      Assessment & Plan  1. Need for immunization against influenza  - Flu vaccine HIGH  DOSE PF (Fluzone High dose)  2. Encounter for annual physical exam Obtain age-appropriate screening lab work - CBC with Differential/Platelet - COMPLETE METABOLIC PANEL WITH GFR - Lipid panel - TSH - VITAMIN D 25 Hydroxy (Vit-D Deficiency, Fractures)  3. Elevated PSA Elevated PSA from last year, patient was apparently was not contacted by the urologist, we'll repeat levels and place another referral to the specialist - PSA  Charleene Callegari Asad A. [facility] 11/23/2017 11:51 AM

## 2017-11-24 LAB — LIPID PANEL
Cholesterol: 212 mg/dL — ABNORMAL HIGH (ref <200)
HDL: 39 mg/dL — ABNORMAL LOW (ref >40)
LDL Cholesterol (Calc): 138 mg/dL (calc) — ABNORMAL HIGH
Non-HDL Cholesterol (Calc): 173 mg/dL (calc) — ABNORMAL HIGH (ref <130)
Total CHOL/HDL Ratio: 5.4 (calc) — ABNORMAL HIGH (ref <5.0)
Triglycerides: 209 mg/dL — ABNORMAL HIGH (ref <150)

## 2017-11-24 LAB — TSH: TSH: 1.77 m[IU]/L (ref 0.40–4.50)

## 2017-11-24 LAB — COMPLETE METABOLIC PANEL WITH GFR
AG RATIO: 1.7 (calc) (ref 1.0–2.5)
ALT: 13 U/L (ref 9–46)
AST: 17 U/L (ref 10–35)
Albumin: 4.2 g/dL (ref 3.6–5.1)
Alkaline phosphatase (APISO): 65 U/L (ref 40–115)
BUN/Creatinine Ratio: 11 (calc) (ref 6–22)
BUN: 30 mg/dL — ABNORMAL HIGH (ref 7–25)
CALCIUM: 9.2 mg/dL (ref 8.6–10.3)
CO2: 28 mmol/L (ref 20–32)
CREATININE: 2.7 mg/dL — AB (ref 0.70–1.18)
Chloride: 104 mmol/L (ref 98–110)
GFR, EST NON AFRICAN AMERICAN: 22 mL/min/{1.73_m2} — AB (ref >=60)
GFR, Est African American: 26 mL/min/{1.73_m2} — ABNORMAL LOW (ref >=60)
GLOBULIN: 2.5 g/dL (ref 1.9–3.7)
Glucose, Bld: 103 mg/dL — ABNORMAL HIGH (ref 65–99)
POTASSIUM: 5.2 mmol/L (ref 3.5–5.3)
SODIUM: 140 mmol/L (ref 135–146)
TOTAL PROTEIN: 6.7 g/dL (ref 6.1–8.1)
Total Bilirubin: 0.8 mg/dL (ref 0.2–1.2)

## 2017-11-24 LAB — CBC WITH DIFFERENTIAL/PLATELET
Basophils Absolute: 38 cells/uL (ref 0–200)
Basophils Relative: 0.7 %
Eosinophils Absolute: 140 cells/uL (ref 15–500)
Eosinophils Relative: 2.6 %
HCT: 37.8 % — ABNORMAL LOW (ref 38.5–50.0)
Hemoglobin: 12.9 g/dL — ABNORMAL LOW (ref 13.2–17.1)
Lymphs Abs: 1226 cells/uL (ref 850–3900)
MCH: 31.3 pg (ref 27.0–33.0)
MCHC: 34.1 g/dL (ref 32.0–36.0)
MCV: 91.7 fL (ref 80.0–100.0)
MPV: 11.1 fL (ref 7.5–12.5)
Monocytes Relative: 9.4 %
Neutro Abs: 3488 cells/uL (ref 1500–7800)
Neutrophils Relative %: 64.6 %
Platelets: 151 10*3/uL (ref 140–400)
RBC: 4.12 10*6/uL — ABNORMAL LOW (ref 4.20–5.80)
RDW: 13 % (ref 11.0–15.0)
Total Lymphocyte: 22.7 %
WBC mixed population: 508 cells/uL (ref 200–950)
WBC: 5.4 10*3/uL (ref 3.8–10.8)

## 2017-11-24 LAB — PSA: PSA: 7.4 ng/mL — ABNORMAL HIGH (ref <=4.0)

## 2017-11-24 LAB — VITAMIN D 25 HYDROXY (VIT D DEFICIENCY, FRACTURES): Vit D, 25-Hydroxy: 33 ng/mL (ref 30–100)

## 2017-11-30 ENCOUNTER — Telehealth: Payer: Self-pay

## 2017-12-07 ENCOUNTER — Ambulatory Visit: Payer: Medicare HMO | Admitting: Family Medicine

## 2017-12-12 NOTE — Progress Notes (Deleted)
12/13/2017 7:52 AM   Robert Hansen [birth year] [id number]  Referring provider: Roselee Nova, MD [address] [place], [place] [address]  No chief complaint on file.   HPI: Patient is a 77 year old Caucasian male with CKD, DM, CAD and a history of colon cancer who is referred to [place] by Sr. Rochel Brome for an elevated PSA.  He was found to have a PSA of 11.3 in 10/2016 and was referred to urology at that time.  He was not contacted for an appointment, so his PSA was repeated in 10/2017 and was found to be 7.4.    His IPSS score today is ***, which is *** lower urinary tract symptomatology. He is *** with his quality life due to his urinary symptoms. His PVR is *** mL.  His previous IPSS score was ***.  His previous PVR is *** mL.    His major complaint today ***.  He has had these symptoms for *** years.  He denies any dysuria, hematuria or suprapubic pain.   His has had ***.    He also denies any recent fevers, chills, nausea or vomiting.  He has a family history of PCa, with ***.   He does not have a family history of PCa.***    Score:  1-7 Mild 8-19 Moderate 20-35 Severe   Erectile dysfunction His SHIM score is ***, which is ***.   His previous SHIM score was ***.  He has been having difficulty with erections for ***.   His major complaint is ***.  His libido is ***.   His risk factors for ED are age, BPH,CKD, DM, HTN, HLD and CAD.   He denies any painful erections or curvatures with his erections.   He is still having/no longer having spontaneous erections.  He has tried *** in the past.       Score: 1-7 Severe ED 8-11 Moderate ED 12-16 Mild-Moderate ED 17-21 Mild ED 22-25 No ED   Reviewed referral notes.    See HPI  PMH: Past Medical History:  Diagnosis Date  . Allergy   . Arthritis   . Cancer ([place]) 2011   colon, SIGMOID COLON RESECTION: INVASIVE ADENOCARCINOMA  . Cholelithiasis   . Chronic kidney disease    CREAT 1.9 3 YEARS  AGO  . Colon polyp 11/02/2011   tubular adenoma  . Coronary artery disease   . Diabetes mellitus without complication (HCC)    borderline  . GERD (gastroesophageal reflux disease)   . Heart murmur   . Hyperlipidemia   . Hypertension     Surgical History: Past Surgical History:  Procedure Laterality Date  . CHOLECYSTECTOMY N/A 04/19/2016   Procedure: LAPAROSCOPIC CHOLECYSTECTOMY;  Surgeon: Clayburn Pert, MD;  Location: [facility] ORS;  Service: General;  Laterality: N/A;  . COLON SURGERY  07/15/2010   Dr. Pat Patrick, [facility], SIGMOID COLON RESECTION:   . COLONOSCOPY  11/02/2011   Dr Pat Patrick  . COLONOSCOPY WITH PROPOFOL N/A 01/11/2017   Procedure: COLONOSCOPY WITH PROPOFOL;  Surgeon: Robert Bellow, MD;  Location: [facility] ENDOSCOPY;  Service: Endoscopy;  Laterality: N/A;  . CORONARY ANGIOPLASTY WITH STENT PLACEMENT  2000   Dr. Humphrey Rolls, [place] Medications:  Allergies as of 12/13/2017      Reactions   Codeine Nausea And Vomiting   Morphine Nausea Only      Medication List        Accurate as of 12/12/17  7:52 AM. Always use  your most recent med list.          amLODipine 10 MG tablet Commonly known as:  NORVASC Take 10 mg by mouth daily.   aspirin 81 MG tablet Take 81 mg by mouth at bedtime.   cilostazol 50 MG tablet Commonly known as:  PLETAL Take 50 mg by mouth 2 (two) times daily.   clopidogrel 75 MG tablet Commonly known as:  PLAVIX Take 75 mg by mouth daily.   Fish Oil 1200 MG Cpdr Take 1 capsule by mouth 2 (two) times daily.   hydrALAZINE 100 MG tablet Commonly known as:  APRESOLINE Take 100 mg by mouth 2 (two) times daily. Reported on 12/16/2015   hydrochlorothiazide 12.5 MG tablet Commonly known as:  HYDRODIURIL Take 12.5 mg by mouth daily.   losartan-hydrochlorothiazide 50-12.5 MG tablet Commonly known as:  HYZAAR   metoprolol tartrate 25 MG tablet Commonly known as:  LOPRESSOR Take 25 mg by mouth 2 (two) times daily.   NITROSTAT 0.4 MG SL tablet Generic  drug:  nitroGLYCERIN Place 0.4 mg under the tongue every 5 (five) minutes as needed. Reported on 04/19/2016       Allergies:  Allergies  Allergen Reactions  . Codeine Nausea And Vomiting  . Morphine Nausea Only    Family History: Family History  Problem Relation Age of Onset  . Diabetes Mother   . Breast cancer Mother        breast    Social History:  reports that  has never smoked. he has never used smokeless tobacco. He reports that he does not drink alcohol or use drugs.  ROS:                                        Physical Exam: There were no vitals taken for this visit.  Constitutional: Well nourished. Alert and oriented, No acute distress. HEENT: [place] AT, moist mucus membranes. Trachea midline, no masses. Cardiovascular: No clubbing, cyanosis, or edema. Respiratory: Normal respiratory effort, no increased work of breathing. GI: Abdomen is soft, non tender, non distended, no abdominal masses. Liver and spleen not palpable.  No hernias appreciated.  Stool sample for occult testing is not indicated.   GU: No CVA tenderness.  No bladder fullness or masses.  Patient with circumcised/uncircumcised phallus. ***Foreskin easily retracted***  Urethral meatus is patent.  No penile discharge. No penile lesions or rashes. Scrotum without lesions, cysts, rashes and/or edema.  Testicles are located scrotally bilaterally. No masses are appreciated in the testicles. Left and right epididymis are normal. Rectal: Patient with  normal sphincter tone. Anus and perineum without scarring or rashes. No rectal masses are appreciated. Prostate is approximately *** grams, *** nodules are appreciated. Seminal vesicles are normal. Skin: No rashes, bruises or suspicious lesions. Lymph: No cervical or inguinal adenopathy. Neurologic: Grossly intact, no focal deficits, moving all 4 extremities. Psychiatric: Normal mood and affect.  Laboratory Data: Lab Results  Component Value  Date   WBC 5.4 11/23/2017   HGB 12.9 (L) 11/23/2017   HCT 37.8 (L) 11/23/2017   MCV 91.7 11/23/2017   PLT 151 11/23/2017    Lab Results  Component Value Date   CREATININE 2.70 (H) 11/23/2017    Lab Results  Component Value Date   PSA 7.4 (H) 11/23/2017   PSA 11.3 (H) 11/03/2016     Lab Results  Component Value Date   HGBA1C 6.0 12/03/2015  Lab Results  Component Value Date   TSH 1.77 11/23/2017       Component Value Date/Time   CHOL 212 (H) 11/23/2017 1208   CHOL 140 12/03/2015 0855   HDL 39 (L) 11/23/2017 1208   HDL 38 (L) 12/03/2015 0855   CHOLHDL 5.4 (H) 11/23/2017 1208   VLDL 21 11/03/2016 0838   LDLCALC 68 11/03/2016 0838   LDLCALC 68 12/03/2015 0855    Lab Results  Component Value Date   AST 17 11/23/2017   Lab Results  Component Value Date   ALT 13 11/23/2017   Urinalysis    Component Value Date/Time   COLORURINE STRAW (A) 03/15/2016 2203   APPEARANCEUR CLEAR (A) 03/15/2016 2203   LABSPEC 1.002 (L) 03/15/2016 2203   PHURINE 6.0 03/15/2016 2203   GLUCOSEU NEGATIVE 03/15/2016 2203   HGBUR NEGATIVE 03/15/2016 2203   BILIRUBINUR NEGATIVE 03/15/2016 2203   KETONESUR NEGATIVE 03/15/2016 2203   PROTEINUR 30 (A) 03/15/2016 2203   NITRITE NEGATIVE 03/15/2016 2203   LEUKOCYTESUR NEGATIVE 03/15/2016 2203    I have reviewed the labs.   Pertinent Imaging: *** I have independently reviewed the films.    Assessment & Plan:  ***  1. Elevated PSA  - I discussed the AUA Guideline's (2013) for men aged 42+ years or any man with less than a 10 to 49 year life expectancy that screening is not recommended as the diagnosis and treatment of prostate cancer in this age group provides more risk than benefit unless patient is having significant urinary and/or pain from the cancer  - since a PSA value has been obtained at this time the decision is now to continue with the screening or discontinue the screening until symptoms arise  - ***  If the individual  is in excellent health and after discussion it is decided to do a screening PSA, the threshold for biopsy should be raised to 10 ng/mL and if the PSA returns below 3 ng/mL, discontinue screening.  2. BPH with LUTS  - IPSS score is ***, it is stable/improving/worsening  - Continue conservative management, avoiding bladder irritants and timed voiding's  - most bothersome symptoms is/are ***  - Initiate alpha-blocker (***), discussed side effects ***  - Initiate 5 alpha reductase inhibitor (***), discussed side effects ***  - Continue tamsulosin 0.4 mg daily, alfuzosin 10 mg daily, Rapaflo 8 mg daily, terazosin, doxazosin, Cialis 5 mg daily and finasteride 5 mg daily, dutasteride 0.5 mg daily***:refills given  - Cannot tolerate medication or medication failure, schedule cystoscopy ***  - RTC in *** months for IPSS, PSA, PVR and exam    Erectile dysfunction  - SHIM score is ***, it is stable, worsening, improving  - I explained to the patient that in order to achieve an erection it takes good functioning of the nervous system (parasympathetic and rs, sympathetic, sensory and motor), good blood flow into the erectile tissue of the penis and a desire to have sex  - I explained that conditions like diabetes, hypertension, coronary artery disease, peripheral vascular disease, smoking, alcohol consumption, age, sleep apnea and BPH can diminish the ability to have an erection  - I explained the ED may be a risk marker for underlying CVD and he should follow up with PCP for further studies ***  - ED is an impairment in the arousal phase of the sexual response cycle.  Desire, orgasm and resolution are not considered ED and should be approached as a separate issue  - we will obtain  a serum testosterone level at this time; if it is abnormal we will need to repeat the study for confirmation  - A recent study published in Sex Med 2018 Apr 13 revealed moderate to vigorous aerobic exercise for 40 minutes 4 times  per week can decrease erectile problems caused by physical inactivity, obesity, hypertension, metabolic syndrome and/or cardiovascular diseases  - We discussed trying a *** different PDE5 inhibitor, intra-urethral suppositories, intracavernous vasoactive drug injection therapy, vacuum constriction device and penile prosthesis implantation  - He would like to try ***  - Continue Cialis, Viagra, Levitra, Stendra  - RTC in *** months for repeat SHIM score and exam    No Follow-up on file.  These notes generated with voice recognition software. I apologize for typographical errors.  Zara Council, [facility] [address], [place], [place] [address] [phone]

## 2017-12-12 NOTE — Telephone Encounter (Signed)
erroneous

## 2017-12-13 ENCOUNTER — Encounter: Payer: Self-pay | Admitting: Urology

## 2017-12-13 ENCOUNTER — Ambulatory Visit: Payer: Medicare HMO | Admitting: Urology

## 2017-12-22 DIAGNOSIS — R0602 Shortness of breath: Secondary | ICD-10-CM | POA: Diagnosis not present

## 2017-12-22 DIAGNOSIS — K210 Gastro-esophageal reflux disease with esophagitis: Secondary | ICD-10-CM | POA: Diagnosis not present

## 2017-12-22 DIAGNOSIS — I10 Essential (primary) hypertension: Secondary | ICD-10-CM | POA: Diagnosis not present

## 2017-12-22 DIAGNOSIS — Z9861 Coronary angioplasty status: Secondary | ICD-10-CM | POA: Diagnosis not present

## 2017-12-22 DIAGNOSIS — I2510 Atherosclerotic heart disease of native coronary artery without angina pectoris: Secondary | ICD-10-CM | POA: Diagnosis not present

## 2018-01-09 DIAGNOSIS — R079 Chest pain, unspecified: Secondary | ICD-10-CM | POA: Diagnosis not present

## 2018-01-12 DIAGNOSIS — I739 Peripheral vascular disease, unspecified: Secondary | ICD-10-CM | POA: Diagnosis not present

## 2018-01-12 DIAGNOSIS — E785 Hyperlipidemia, unspecified: Secondary | ICD-10-CM | POA: Diagnosis not present

## 2018-01-12 DIAGNOSIS — I10 Essential (primary) hypertension: Secondary | ICD-10-CM | POA: Diagnosis not present

## 2018-01-12 DIAGNOSIS — R0602 Shortness of breath: Secondary | ICD-10-CM | POA: Diagnosis not present

## 2018-01-12 DIAGNOSIS — I2510 Atherosclerotic heart disease of native coronary artery without angina pectoris: Secondary | ICD-10-CM | POA: Diagnosis not present

## 2018-02-22 DIAGNOSIS — I10 Essential (primary) hypertension: Secondary | ICD-10-CM | POA: Diagnosis not present

## 2018-02-22 DIAGNOSIS — R42 Dizziness and giddiness: Secondary | ICD-10-CM | POA: Diagnosis not present

## 2018-02-22 DIAGNOSIS — I2510 Atherosclerotic heart disease of native coronary artery without angina pectoris: Secondary | ICD-10-CM | POA: Diagnosis not present

## 2018-02-22 DIAGNOSIS — E785 Hyperlipidemia, unspecified: Secondary | ICD-10-CM | POA: Diagnosis not present

## 2018-09-28 ENCOUNTER — Ambulatory Visit: Payer: Medicare HMO

## 2018-09-29 ENCOUNTER — Observation Stay
Admission: EM | Admit: 2018-09-29 | Discharge: 2018-09-30 | Disposition: A | Discharge status: Home / Self Care | Payer: Medicare HMO | Attending: Internal Medicine | Admitting: Internal Medicine

## 2018-09-29 ENCOUNTER — Encounter: Payer: Self-pay | Admitting: Emergency Medicine

## 2018-09-29 ENCOUNTER — Other Ambulatory Visit: Payer: Self-pay

## 2018-09-29 DIAGNOSIS — N179 Acute kidney failure, unspecified: Principal | ICD-10-CM | POA: Insufficient documentation

## 2018-09-29 DIAGNOSIS — Z885 Allergy status to narcotic agent status: Secondary | ICD-10-CM | POA: Insufficient documentation

## 2018-09-29 DIAGNOSIS — Z7902 Long term (current) use of antithrombotics/antiplatelets: Secondary | ICD-10-CM | POA: Diagnosis not present

## 2018-09-29 DIAGNOSIS — N401 Enlarged prostate with lower urinary tract symptoms: Secondary | ICD-10-CM | POA: Insufficient documentation

## 2018-09-29 DIAGNOSIS — E1122 Type 2 diabetes mellitus with diabetic chronic kidney disease: Secondary | ICD-10-CM | POA: Diagnosis not present

## 2018-09-29 DIAGNOSIS — I10 Essential (primary) hypertension: Secondary | ICD-10-CM

## 2018-09-29 DIAGNOSIS — R778 Other specified abnormalities of plasma proteins: Secondary | ICD-10-CM

## 2018-09-29 DIAGNOSIS — R338 Other retention of urine: Secondary | ICD-10-CM | POA: Insufficient documentation

## 2018-09-29 DIAGNOSIS — R61 Generalized hyperhidrosis: Secondary | ICD-10-CM | POA: Diagnosis not present

## 2018-09-29 DIAGNOSIS — K219 Gastro-esophageal reflux disease without esophagitis: Secondary | ICD-10-CM | POA: Insufficient documentation

## 2018-09-29 DIAGNOSIS — N184 Chronic kidney disease, stage 4 (severe): Secondary | ICD-10-CM | POA: Diagnosis not present

## 2018-09-29 DIAGNOSIS — N281 Cyst of kidney, acquired: Secondary | ICD-10-CM | POA: Diagnosis not present

## 2018-09-29 DIAGNOSIS — N189 Chronic kidney disease, unspecified: Secondary | ICD-10-CM

## 2018-09-29 DIAGNOSIS — E1142 Type 2 diabetes mellitus with diabetic polyneuropathy: Secondary | ICD-10-CM | POA: Diagnosis not present

## 2018-09-29 DIAGNOSIS — R7989 Other specified abnormal findings of blood chemistry: Secondary | ICD-10-CM

## 2018-09-29 DIAGNOSIS — I129 Hypertensive chronic kidney disease with stage 1 through stage 4 chronic kidney disease, or unspecified chronic kidney disease: Secondary | ICD-10-CM | POA: Diagnosis not present

## 2018-09-29 DIAGNOSIS — Z79899 Other long term (current) drug therapy: Secondary | ICD-10-CM | POA: Diagnosis not present

## 2018-09-29 DIAGNOSIS — E785 Hyperlipidemia, unspecified: Secondary | ICD-10-CM | POA: Insufficient documentation

## 2018-09-29 DIAGNOSIS — Z955 Presence of coronary angioplasty implant and graft: Secondary | ICD-10-CM | POA: Diagnosis not present

## 2018-09-29 DIAGNOSIS — N3289 Other specified disorders of bladder: Secondary | ICD-10-CM | POA: Diagnosis not present

## 2018-09-29 DIAGNOSIS — Z7982 Long term (current) use of aspirin: Secondary | ICD-10-CM | POA: Insufficient documentation

## 2018-09-29 DIAGNOSIS — E876 Hypokalemia: Secondary | ICD-10-CM | POA: Diagnosis not present

## 2018-09-29 DIAGNOSIS — E1151 Type 2 diabetes mellitus with diabetic peripheral angiopathy without gangrene: Secondary | ICD-10-CM | POA: Insufficient documentation

## 2018-09-29 DIAGNOSIS — I2510 Atherosclerotic heart disease of native coronary artery without angina pectoris: Secondary | ICD-10-CM | POA: Insufficient documentation

## 2018-09-29 DIAGNOSIS — Z23 Encounter for immunization: Secondary | ICD-10-CM | POA: Diagnosis not present

## 2018-09-29 DIAGNOSIS — Z85038 Personal history of other malignant neoplasm of large intestine: Secondary | ICD-10-CM | POA: Diagnosis not present

## 2018-09-29 LAB — TROPONIN I
Troponin I: 0.05 ng/mL (ref <0.03)
Troponin I: 0.04 ng/mL (ref <0.03)

## 2018-09-29 LAB — CK: Total CK: 88 U/L (ref 49–397)

## 2018-09-29 LAB — CBC WITH DIFFERENTIAL/PLATELET
ABS IMMATURE GRANULOCYTES: 0.02 10*3/uL (ref 0.00–0.07)
BASOS ABS: 0.1 10*3/uL (ref 0.0–0.1)
BASOS PCT: 1 %
EOS ABS: 0.1 10*3/uL (ref 0.0–0.5)
Eosinophils Relative: 2 %
HCT: 37.9 % — ABNORMAL LOW (ref 39.0–52.0)
Hemoglobin: 12.7 g/dL — ABNORMAL LOW (ref 13.0–17.0)
IMMATURE GRANULOCYTES: 0 %
Lymphocytes Relative: 23 %
Lymphs Abs: 1.6 10*3/uL (ref 0.7–4.0)
MCH: 31.1 pg (ref 26.0–34.0)
MCHC: 33.5 g/dL (ref 30.0–36.0)
MCV: 92.7 fL (ref 80.0–100.0)
MONOS PCT: 6 %
Monocytes Absolute: 0.4 10*3/uL (ref 0.1–1.0)
NRBC: 0 % (ref 0.0–0.2)
Neutro Abs: 4.9 10*3/uL (ref 1.7–7.7)
Neutrophils Relative %: 68 %
PLATELETS: 179 10*3/uL (ref 150–400)
RBC: 4.09 MIL/uL — ABNORMAL LOW (ref 4.22–5.81)
RDW: 12.5 % (ref 11.5–15.5)
WBC: 7.1 10*3/uL (ref 4.0–10.5)

## 2018-09-29 LAB — COMPREHENSIVE METABOLIC PANEL
ALT: 17 U/L (ref 0–44)
ANION GAP: 9 (ref 5–15)
AST: 25 U/L (ref 15–41)
Albumin: 3.7 g/dL (ref 3.5–5.0)
Alkaline Phosphatase: 64 U/L (ref 38–126)
BUN: 29 mg/dL — AB (ref 8–23)
CHLORIDE: 109 mmol/L (ref 98–111)
CO2: 24 mmol/L (ref 22–32)
Calcium: 8.8 mg/dL — ABNORMAL LOW (ref 8.9–10.3)
Creatinine, Ser: 3.68 mg/dL — ABNORMAL HIGH (ref 0.61–1.24)
GFR calc Af Amer: 17 mL/min — ABNORMAL LOW (ref >60)
GFR, EST NON AFRICAN AMERICAN: 15 mL/min — AB (ref >60)
Glucose, Bld: 100 mg/dL — ABNORMAL HIGH (ref 70–99)
POTASSIUM: 3.6 mmol/L (ref 3.5–5.1)
Sodium: 142 mmol/L (ref 135–145)
TOTAL PROTEIN: 7.1 g/dL (ref 6.5–8.1)
Total Bilirubin: 0.7 mg/dL (ref 0.3–1.2)

## 2018-09-29 LAB — BASIC METABOLIC PANEL
Anion gap: 11 (ref 5–15)
BUN: 28 mg/dL — AB (ref 8–23)
CHLORIDE: 111 mmol/L (ref 98–111)
CO2: 19 mmol/L — ABNORMAL LOW (ref 22–32)
CREATININE: 3.63 mg/dL — AB (ref 0.61–1.24)
Calcium: 8.3 mg/dL — ABNORMAL LOW (ref 8.9–10.3)
GFR, EST AFRICAN AMERICAN: 17 mL/min — AB (ref >60)
GFR, EST NON AFRICAN AMERICAN: 15 mL/min — AB (ref >60)
Glucose, Bld: 105 mg/dL — ABNORMAL HIGH (ref 70–99)
Potassium: 3.3 mmol/L — ABNORMAL LOW (ref 3.5–5.1)
SODIUM: 141 mmol/L (ref 135–145)

## 2018-09-29 LAB — TSH: TSH: 1.813 u[IU]/mL (ref 0.350–4.500)

## 2018-09-29 LAB — CBC
HCT: 33.4 % — ABNORMAL LOW (ref 39.0–52.0)
Hemoglobin: 11.3 g/dL — ABNORMAL LOW (ref 13.0–17.0)
MCH: 31.2 pg (ref 26.0–34.0)
MCHC: 33.8 g/dL (ref 30.0–36.0)
MCV: 92.3 fL (ref 80.0–100.0)
Platelets: 152 10*3/uL (ref 150–400)
RBC: 3.62 MIL/uL — AB (ref 4.22–5.81)
RDW: 12.6 % (ref 11.5–15.5)
WBC: 6.9 10*3/uL (ref 4.0–10.5)
nRBC: 0 % (ref 0.0–0.2)

## 2018-09-29 LAB — HEMOGLOBIN A1C
Hgb A1c MFr Bld: 5.8 % — ABNORMAL HIGH (ref 4.8–5.6)
MEAN PLASMA GLUCOSE: 119.76 mg/dL

## 2018-09-29 LAB — GLUCOSE, CAPILLARY: GLUCOSE-CAPILLARY: 95 mg/dL (ref 70–99)

## 2018-09-29 MED ORDER — HYDRALAZINE HCL 50 MG PO TABS
100.0000 mg | ORAL_TABLET | Freq: Once | ORAL | Status: AC
  Administered 2018-09-29: 100 mg via ORAL
  Filled 2018-09-29: qty 2

## 2018-09-29 MED ORDER — INFLUENZA VAC SPLIT HIGH-DOSE 0.5 ML IM SUSY
0.5000 mL | PREFILLED_SYRINGE | INTRAMUSCULAR | Status: AC
  Administered 2018-09-30: 0.5 mL via INTRAMUSCULAR
  Filled 2018-09-29: qty 0.5

## 2018-09-29 MED ORDER — HYDRALAZINE HCL 50 MG PO TABS
100.0000 mg | ORAL_TABLET | Freq: Three times a day (TID) | ORAL | Status: DC
  Administered 2018-09-29 – 2018-09-30 (×2): 100 mg via ORAL
  Filled 2018-09-29 (×2): qty 2

## 2018-09-29 MED ORDER — ISOSORBIDE MONONITRATE ER 60 MG PO TB24
30.0000 mg | ORAL_TABLET | Freq: Every day | ORAL | Status: DC
  Administered 2018-09-29 – 2018-09-30 (×2): 30 mg via ORAL
  Filled 2018-09-29 (×3): qty 1

## 2018-09-29 MED ORDER — EZETIMIBE 10 MG PO TABS
10.0000 mg | ORAL_TABLET | Freq: Every day | ORAL | Status: DC
  Administered 2018-09-30: 10 mg via ORAL
  Filled 2018-09-29: qty 1

## 2018-09-29 MED ORDER — CLOPIDOGREL BISULFATE 75 MG PO TABS
75.0000 mg | ORAL_TABLET | Freq: Every day | ORAL | Status: DC
  Administered 2018-09-30: 75 mg via ORAL
  Filled 2018-09-29: qty 1

## 2018-09-29 MED ORDER — NITROGLYCERIN 0.4 MG SL SUBL: 0.4000 mg | SUBLINGUAL_TABLET | SUBLINGUAL | Status: DC | PRN

## 2018-09-29 MED ORDER — ONDANSETRON HCL 4 MG PO TABS: 4.0000 mg | ORAL_TABLET | Freq: Four times a day (QID) | ORAL | Status: DC | PRN

## 2018-09-29 MED ORDER — ACETAMINOPHEN 650 MG RE SUPP: 650.0000 mg | Freq: Four times a day (QID) | RECTAL | Status: DC | PRN

## 2018-09-29 MED ORDER — PNEUMOCOCCAL VAC POLYVALENT 25 MCG/0.5ML IJ INJ
0.5000 mL | INJECTION | INTRAMUSCULAR | Status: AC
  Administered 2018-09-30: 0.5 mL via INTRAMUSCULAR
  Filled 2018-09-29: qty 0.5

## 2018-09-29 MED ORDER — ASPIRIN EC 81 MG PO TBEC
81.0000 mg | DELAYED_RELEASE_TABLET | Freq: Every day | ORAL | Status: DC
  Administered 2018-09-29: 81 mg via ORAL
  Filled 2018-09-29: qty 1

## 2018-09-29 MED ORDER — HEPARIN SODIUM (PORCINE) 5000 UNIT/ML IJ SOLN
5000.0000 [IU] | Freq: Three times a day (TID) | INTRAMUSCULAR | Status: DC
  Administered 2018-09-29 – 2018-09-30 (×2): 5000 [IU] via SUBCUTANEOUS
  Filled 2018-09-29 (×2): qty 1

## 2018-09-29 MED ORDER — AMLODIPINE BESYLATE 5 MG PO TABS
10.0000 mg | ORAL_TABLET | Freq: Every day | ORAL | Status: DC
  Administered 2018-09-30: 10 mg via ORAL
  Filled 2018-09-29: qty 2

## 2018-09-29 MED ORDER — SODIUM CHLORIDE 0.9 % IV BOLUS
1000.0000 mL | Freq: Once | INTRAVENOUS | Status: AC
  Administered 2018-09-29: 1000 mL via INTRAVENOUS

## 2018-09-29 MED ORDER — ONDANSETRON HCL 4 MG/2ML IJ SOLN: 4.0000 mg | Freq: Four times a day (QID) | INTRAMUSCULAR | Status: DC | PRN

## 2018-09-29 MED ORDER — SODIUM CHLORIDE 0.9 % IV SOLN
INTRAVENOUS | Status: DC
  Administered 2018-09-29: 20:00:00 via INTRAVENOUS

## 2018-09-29 MED ORDER — OMEGA-3-ACID ETHYL ESTERS 1 G PO CAPS
1.0000 | ORAL_CAPSULE | Freq: Two times a day (BID) | ORAL | Status: DC
  Administered 2018-09-29 – 2018-09-30 (×2): 1 g via ORAL
  Filled 2018-09-29 (×2): qty 1

## 2018-09-29 MED ORDER — ACETAMINOPHEN 325 MG PO TABS: 650.0000 mg | ORAL_TABLET | Freq: Four times a day (QID) | ORAL | Status: DC | PRN

## 2018-09-29 NOTE — ED Triage Notes (Signed)
Patient to ER for c/o sweating to lower extremities since day before yesterday. States he does have h/o HTN, but has also been hypertensive despite taking his medication as well. Patient states he feels like he has a dry mouth as well. Has been told he is "border line diabetic". Denies any pain anywhere, denies dizziness.

## 2018-09-29 NOTE — ED Notes (Signed)
Report to Beth, RN

## 2018-09-29 NOTE — Progress Notes (Signed)
Patient ID: Robert Hansen, male   DOB: 1941/05/20, 77 y.o.   MRN: [id number]  ACP Note  Patient and wife present  Diagnosis: Acute kidney injury on chronic kidney disease stage IV, hypertension, hyperlipidemia, history of CAD, history of type 2 diabetes, elevated troponin.  CODE STATUS discussed and he wishes to be a full code.  Plan.  IV fluid hydration and see if I can get his kidney function better.  Renal ultrasound, nephrology consultation, cut back on soda.  For his hypertension I will add Imdur and continue Norvasc and hydralazine.  Hyperlipidemia we will check a CPK and hold his cholesterol medication statin at this time.  Time spent on ACP discussion 17 minutes Dr. Loletha Grayer

## 2018-09-29 NOTE — ED Provider Notes (Signed)
[facility] Emergency Department Provider Note  ____________________________________________  Time seen: Approximately 4:30 PM  I have reviewed the triage vital signs and the nursing notes.   HISTORY  Chief Complaint Excessive Sweating   HPI Robert Hansen is a 77 y.o. male with a history of colon cancer, chronic kidney disease, CAD status post stent on Plavix, diabetes, hypertension, hyperlipidemia who presents for evaluation of sweating of his bilateral lower extremities.  Patient reports that over the last week he has been having episodes where he sweats profusely on both his legs.  Is also complaining of dry mouth.  He denies pain, weakness, numbness of his legs, no back pain or abdominal pain, no dizziness or chills.  He reports that he mentioned this to his primary care doctor but did not seem concerned about it.  His symptoms are intermittent but not resolving which prompt his visit to the emergency room.  He also is complaining of persistently elevated blood pressure.  He is on amlodipine and hydralazine.  Endorses compliance with the medications.  He reports that he mentioned that his cardiologist on a recent appointment this week but no changes on the medication were done.  He denies chest pain, shortness of breath, dizziness, headache, or any signs of stroke.   Past Medical History:  Diagnosis Date  . Allergy   . Arthritis   . Cancer ([place]) 2011   colon, SIGMOID COLON RESECTION: INVASIVE ADENOCARCINOMA  . Cholelithiasis   . Chronic kidney disease    CREAT 1.9 3 YEARS AGO  . Colon polyp 11/02/2011   tubular adenoma  . Coronary artery disease   . Diabetes mellitus without complication (HCC)    borderline  . GERD (gastroesophageal reflux disease)   . Heart murmur   . Hyperlipidemia   . Hypertension     Patient Active Problem List   Diagnosis Date Noted  . Elevated PSA 11/04/2016  . Annual physical exam 10/25/2016  . Nonrheumatic mitral  valve insufficiency 05/04/2016  . Calculus of gallbladder with acute cholecystitis without obstruction   . Cancer of colon ([place]) 03/31/2016  . Arteriosclerosis of coronary artery 03/31/2016  . Atherosclerosis of coronary artery 03/31/2016  . CKD (chronic kidney disease) 03/31/2016  . Sex counseling 03/31/2016  . Essential (primary) hypertension 03/31/2016  . Acid reflux 03/31/2016  . H/O malignant neoplasm of colon 03/31/2016  . H/O acute myocardial infarction 03/31/2016  . BP (high blood pressure) 03/31/2016  . Combined fat and carbohydrate induced hyperlipemia 03/31/2016  . Gallbladder calculus without cholecystitis and no obstruction 03/17/2016  . Cholecystitis 03/15/2016  . Hypertriglyceridemia 12/16/2015  . Elevated serum creatinine 12/16/2015  . Dizziness 12/16/2015  . Hyperglycemia 12/03/2015  . Biliary colic 79/89/2119  . Hypertension 07/15/2015  . Hyperlipidemia 07/15/2015  . Cardiac murmur 07/15/2015  . CAD S/P percutaneous coronary angioplasty 07/15/2015    Past Surgical History:  Procedure Laterality Date  . CHOLECYSTECTOMY N/A 04/19/2016   Procedure: LAPAROSCOPIC CHOLECYSTECTOMY;  Surgeon: Clayburn Pert, MD;  Location: [facility] ORS;  Service: General;  Laterality: N/A;  . COLON SURGERY  07/15/2010   Dr. Pat Patrick, [facility], SIGMOID COLON RESECTION:   . COLONOSCOPY  11/02/2011   Dr Pat Patrick  . COLONOSCOPY WITH PROPOFOL N/A 01/11/2017   Procedure: COLONOSCOPY WITH PROPOFOL;  Surgeon: Robert Bellow, MD;  Location: [facility] ENDOSCOPY;  Service: Endoscopy;  Laterality: N/A;  . CORONARY ANGIOPLASTY WITH STENT PLACEMENT  2000   Dr. Humphrey Rolls, [facility]    Prior to Admission medications   Medication Sig  Start Date End Date Taking? Authorizing Provider  amLODipine (NORVASC) 10 MG tablet Take 10 mg by mouth daily.    [provider]  aspirin 81 MG tablet Take 81 mg by mouth at bedtime.  03/24/15   [provider]  cilostazol (PLETAL) 50 MG tablet Take 50 mg by mouth 2 (two) times  daily.    [provider]  clopidogrel (PLAVIX) 75 MG tablet Take 75 mg by mouth daily.    [provider]  hydrALAZINE (APRESOLINE) 100 MG tablet Take 100 mg by mouth 2 (two) times daily. Reported on 12/16/2015 07/07/15   [provider]  hydrochlorothiazide (HYDRODIURIL) 12.5 MG tablet Take 12.5 mg by mouth daily.  07/07/15   [provider]  losartan-hydrochlorothiazide Konrad Penta) 50-12.5 MG tablet  10/17/17   [provider]  metoprolol tartrate (LOPRESSOR) 25 MG tablet Take 25 mg by mouth 2 (two) times daily.  07/07/15   [provider]  nitroGLYCERIN (NITROSTAT) 0.4 MG SL tablet Place 0.4 mg under the tongue every 5 (five) minutes as needed. Reported on 04/19/2016    [provider]  Omega-3 Fatty Acids (FISH OIL) 1200 MG CPDR Take 1 capsule by mouth 2 (two) times daily.    [provider]    Allergies Codeine and Morphine  Family History  Problem Relation Age of Onset  . Diabetes Mother   . Breast cancer Mother        breast    Social History Social History   Tobacco Use  . Smoking status: Never Smoker  . Smokeless tobacco: Never Used  Substance Use Topics  . Alcohol use: No    Alcohol/week: 0.0 standard drinks    Frequency: Never  . Drug use: No    Review of Systems  Constitutional: Negative for fever. Eyes: Negative for visual changes. ENT: Negative for sore throat. Neck: No neck pain  Cardiovascular: Negative for chest pain. Respiratory: Negative for shortness of breath. Gastrointestinal: Negative for abdominal pain, vomiting or diarrhea. Genitourinary: Negative for dysuria. Musculoskeletal: Negative for back pain. Skin: Negative for rash. + sweating of b/l LE Neurological: Negative for headaches, weakness or numbness. Psych: No SI or HI  ____________________________________________   PHYSICAL EXAM:  VITAL SIGNS: ED Triage Vitals  Enc Vitals Group     BP 09/29/18 1517 (!) 177/94     Pulse  Rate 09/29/18 1517 69     Resp 09/29/18 1517 20     Temp 09/29/18 1517 (!) 97.5 F (36.4 C)     Temp Source 09/29/18 1517 Oral     SpO2 09/29/18 1517 98 %     Weight 09/29/18 1518 176 lb (79.8 kg)     Height 09/29/18 1518 5\' 8"  (1.727 m)     Head Circumference --      Peak Flow --      Pain Score 09/29/18 1518 0     Pain Loc --      Pain Edu? --      Excl. in [place]? --     Constitutional: Alert and oriented. Well appearing and in no apparent distress. HEENT:      Head: Normocephalic and atraumatic.         Eyes: Conjunctivae are normal. Sclera is non-icteric.       Mouth/Throat: Mucous membranes are moist.       Neck: Supple with no signs of meningismus. Cardiovascular: Regular rate and rhythm. No murmurs, gallops, or rubs. 2+ symmetrical distal pulses are present in all extremities.  No JVD. Respiratory: Normal respiratory effort. Lungs are clear to auscultation bilaterally. No wheezes, crackles, or rhonchi.  Gastrointestinal: Soft, non tender, and non distended with positive bowel sounds. No rebound or guarding. Musculoskeletal: Nontender with normal range of motion in all extremities. No edema, cyanosis, or erythema of extremities. No sweating. Strong DP and PT pulses bilaterally, extremities are warm and well perfused.  Neurologic: Normal speech and language. Face is symmetric. Intact sensation and strength of b/l LE Skin: Skin is warm, dry and intact. No rash noted. Psychiatric: Mood and affect are normal. Speech and behavior are normal.  ____________________________________________   LABS (all labs ordered are listed, but only abnormal results are displayed)  Labs Reviewed  CBC WITH DIFFERENTIAL/PLATELET - Abnormal; Notable for the following components:      Result Value   RBC 4.09 (*)    Hemoglobin 12.7 (*)    HCT 37.9 (*)    All other components within normal limits  COMPREHENSIVE METABOLIC PANEL - Abnormal; Notable for the following components:   Glucose, Bld 100 (*)      BUN 29 (*)    Creatinine, Ser 3.68 (*)    Calcium 8.8 (*)    GFR calc non Af Amer 15 (*)    GFR calc Af Amer 17 (*)    All other components within normal limits  TROPONIN I - Abnormal; Notable for the following components:   Troponin I 0.05 (*)    All other components within normal limits  GLUCOSE, CAPILLARY  URINALYSIS, COMPLETE (UACMP) WITH MICROSCOPIC   ____________________________________________  EKG  ED ECG REPORT I, Rudene Re, the attending physician, personally viewed and interpreted this ECG.  Normal sinus rhythm, rate of 65, incomplete right bundle branch block, normal QTC, right axis deviation, no ST elevations or depressions.  No significant changes when compared to prior. ____________________________________________  RADIOLOGY   none ____________________________________________   PROCEDURES  Procedure(s) performed: None Procedures Critical Care performed:  None ____________________________________________   INITIAL IMPRESSION / ASSESSMENT AND PLAN / ED COURSE  77 y.o. male with a history of colon cancer, chronic kidney disease, CAD status post stent on Plavix, diabetes, hypertension, hyperlipidemia who presents for evaluation of sweating of his bilateral lower extremities.  No signs of ischemia, cyanosis, weakness, numbness, cellulitis on both legs. No back pain or cauda equina symptoms. Normal neurovascular exam. No abdominal tenderness or pulsatile mass on exam. Unclear why patient is sweating on his legs but at this time low suspicion for life threatening etiology.  BP elevated for several weeks. Currently 177/94. Has not taken afternoon hydralazine dose. Will give it here. Will check labs for end organ damage. EKG with no evidence of ischemia.     _________________________ 5:06 PM on 09/29/2018 -----------------------------------------  Labs showing acute on chronic kidney injury with a GFR of 15.  Troponin slightly elevated 0.05 most likely  due to kidney dysfunction since patient has had no chest pain his EKG shows no ischemic changes.  Blood pressure is trending down is currently 155/88 after hydralazine.  At this time we will give a liter of IV fluid and admit for further management of AoCKD   As part of my medical decision making, I reviewed the following data within the [facility] notes reviewed and incorporated, Labs reviewed , EKG interpreted , Old EKG reviewed, Discussed with admitting physician , Notes from prior ED visits and [place] Controlled Substance Database    Pertinent labs & imaging results that were available during my care  of the patient were reviewed by me and considered in my medical decision making (see chart for details).    ____________________________________________   FINAL CLINICAL IMPRESSION(S) / ED DIAGNOSES  Final diagnoses:  Acute kidney injury superimposed on chronic kidney disease (HCC)  Elevated troponin  Essential hypertension      NEW MEDICATIONS STARTED DURING THIS VISIT:  ED Discharge Orders    None       Note:  This document was prepared using Dragon voice recognition software and may include unintentional dictation errors.    Rudene Re, MD 09/29/18 [phone]

## 2018-09-29 NOTE — Progress Notes (Signed)
Pt received to room 204 from ED. Pt ambulatory to bed with steady gait. Pt denies pain or discomfort. Pt oriented to room. Family at bedside.

## 2018-09-29 NOTE — H&P (Addendum)
[facility] [facility] [place] NAME: Robert Hansen    MR#:  [id number]  DATE OF BIRTH:  02/18/1941  DATE OF ADMISSION:  09/29/2018  PRIMARY CARE PHYSICIAN: Enid Derry  REQUESTING/REFERRING PHYSICIAN: Gonzella Lex  CHIEF COMPLAINT:   Chief Complaint  Patient presents with  . Excessive Sweating    HISTORY OF PRESENT ILLNESS:  Robert Hansen  is a 77 y.o. male with a known history of hypertension chronic kidney disease presents with legs sweating and cold.  His blood pressures been high.  His legs have been tingling.  He has been lightheaded.  Some leg pain.  In the ER he was found to have an elevated creatinine Of 3.68 with a GFR of 15.  Patient states that he was supposed to see Dr. Candiss Norse nephrology but never did.  Patient's wife states that he drinks quite a bit of Dr. Malachi Bonds at least 4 to 5 tall glasses a day.  Hospitalist services were contacted for further evaluation.  PAST MEDICAL HISTORY:   Past Medical History:  Diagnosis Date  . Allergy   . Arthritis   . Cancer ([place]) 2011   colon, SIGMOID COLON RESECTION: INVASIVE ADENOCARCINOMA  . Cholelithiasis   . Chronic kidney disease    CREAT 1.9 3 YEARS AGO  . Colon polyp 11/02/2011   tubular adenoma  . Coronary artery disease   . Diabetes mellitus without complication (HCC)    borderline  . GERD (gastroesophageal reflux disease)   . Heart murmur   . Hyperlipidemia   . Hypertension     PAST SURGICAL HISTORY:   Past Surgical History:  Procedure Laterality Date  . CHOLECYSTECTOMY N/A 04/19/2016   Procedure: LAPAROSCOPIC CHOLECYSTECTOMY;  Surgeon: Clayburn Pert, MD;  Location: [facility] ORS;  Service: General;  Laterality: N/A;  . COLON SURGERY  07/15/2010   Dr. Pat Patrick, [facility], SIGMOID COLON RESECTION:   . COLONOSCOPY  11/02/2011   Dr Pat Patrick  . COLONOSCOPY WITH PROPOFOL N/A 01/11/2017   Procedure: COLONOSCOPY WITH PROPOFOL;  Surgeon: Robert Bellow, MD;  Location: [facility] ENDOSCOPY;   Service: Endoscopy;  Laterality: N/A;  . CORONARY ANGIOPLASTY WITH STENT PLACEMENT  2000   Dr. Humphrey Rolls, [place] HISTORY:   Social History   Tobacco Use  . Smoking status: Never Smoker  . Smokeless tobacco: Never Used  Substance Use Topics  . Alcohol use: No    Alcohol/week: 0.0 standard drinks    Frequency: Never    FAMILY HISTORY:   Family History  Problem Relation Age of Onset  . Diabetes Mother   . Breast cancer Mother        breast    DRUG ALLERGIES:   Allergies  Allergen Reactions  . Codeine Nausea And Vomiting  . Morphine Nausea Only    REVIEW OF SYSTEMS:  CONSTITUTIONAL: No fever, fatigue or weakness.  EYES: No blurred or double vision.  Needs reading glasses. EARS, NOSE, AND THROAT: No tinnitus or ear pain. No sore throat.  Positive for postnasal drip. RESPIRATORY: No cough, shortness of breath, wheezing or hemoptysis.  CARDIOVASCULAR: No chest pain, orthopnea, edema.  GASTROINTESTINAL: No nausea, diarrhea or abdominal pain. No blood in bowel movements.  Vomited x1. GENITOURINARY: No dysuria, hematuria.  ENDOCRINE: No polyuria, nocturia,  HEMATOLOGY: No anemia, easy bruising or bleeding SKIN: No rash or lesion. MUSCULOSKELETAL: No joint pain or arthritis.   NEUROLOGIC: Positive for tingling in his feet.  Feels dizzy and lightheaded. PSYCHIATRY: No anxiety or depression.  MEDICATIONS AT HOME:   Prior to Admission medications   Medication Sig Start Date End Date Taking? Authorizing Provider  amLODipine (NORVASC) 10 MG tablet Take 10 mg by mouth daily.   Yes [provider]  aspirin 81 MG tablet Take 81 mg by mouth at bedtime.  03/24/15  Yes [provider]  clopidogrel (PLAVIX) 75 MG tablet Take 75 mg by mouth daily.   Yes [provider]  ezetimibe (ZETIA) 10 MG tablet Take 10 mg by mouth daily.   Yes [provider]  hydrALAZINE (APRESOLINE) 100 MG tablet Take 100 mg by mouth 3 (three) times daily. Reported on  12/16/2015 07/07/15  Yes [provider]  nitroGLYCERIN (NITROSTAT) 0.4 MG SL tablet Place 0.4 mg under the tongue every 5 (five) minutes as needed. Reported on 04/19/2016   Yes [provider]  Omega-3 Fatty Acids (FISH OIL) 1200 MG CPDR Take 1 capsule by mouth 2 (two) times daily.   Yes [provider]  Pitavastatin Calcium (LIVALO) 2 MG TABS Take 2 mg by mouth daily.   Yes [provider]      VITAL SIGNS:  Blood pressure (!) 174/95, pulse 71, temperature (!) 97.5 F (36.4 C), temperature source Oral, resp. rate 17, height 5\' 8"  (1.727 m), weight 79.8 kg, SpO2 97 %.  PHYSICAL EXAMINATION:  GENERAL:  77 y.o.-year-old patient lying in the bed with no acute distress.  EYES: Pupils equal, round, reactive to light and accommodation. No scleral icterus. Extraocular muscles intact.  HEENT: Head atraumatic, normocephalic. Oropharynx and nasopharynx clear.  NECK:  Supple, no jugular venous distention. No thyroid enlargement, no tenderness.  LUNGS: Normal breath sounds bilaterally, no wheezing, rales,rhonchi or crepitation. No use of accessory muscles of respiration.  CARDIOVASCULAR: S1, S2 normal.  2 out of 6 systolic murmurs.  No rubs, or gallops.  ABDOMEN: Soft, nontender, nondistended. Bowel sounds present. No organomegaly or mass.  EXTREMITIES: Trace pedal edema, no cyanosis, or clubbing.  NEUROLOGIC: Cranial nerves II through XII are intact. Muscle strength 5/5 in all extremities. Sensation intact but feels numb.  Gait not checked.  Patient able to straight leg raise bilaterally. PSYCHIATRIC: The patient is alert and oriented x 3.  SKIN: No rash, lesion, or ulcer.   LABORATORY PANEL:   CBC Recent Labs  Lab 09/29/18 1521  WBC 7.1  HGB 12.7*  HCT 37.9*  PLT 179   ------------------------------------------------------------------------------------------------------------------  Chemistries  Recent Labs  Lab 09/29/18 1521  NA 142  K 3.6  CL 109   CO2 24  GLUCOSE 100*  BUN 29*  CREATININE 3.68*  CALCIUM 8.8*  AST 25  ALT 17  ALKPHOS 64  BILITOT 0.7   ------------------------------------------------------------------------------------------------------------------  Cardiac Enzymes Recent Labs  Lab 09/29/18 1521  TROPONINI 0.05*   ------------------------------------------------------------------------------------------------------------------   EKG:   Sinus rhythm 65 bpm, incomplete right bundle branch block  IMPRESSION AND PLAN:   1.  Acute kidney injury on chronic kidney disease stage IV.  IV fluid hydration.  Advised that he must cut back on his soda.  Send off a CPK, TSH and vitamin B12. 2.  Hypertension.  Continue hydralazine Norvasc and add Imdur 3.  Hyperlipidemia unspecified.  Hold statin because this can be causing the strange leg complaints that he is having.  Check a CPK. 4.  History of CAD on aspirin and Plavix 5.  History of type 2 diabetes mellitus.  Add on a hemoglobin A1c 6.  Elevated troponin this is false positive with acute kidney injury.  Patient has no complaints of chest pain.  I will check a few more troponins.    All the records are reviewed and case discussed with ED provider. Management plans discussed with the patient, family and they are in agreement.  CODE STATUS: Full code  TOTAL TIME TAKING CARE OF THIS PATIENT: 50 minutes, including ACP time   Loletha Grayer M.D on 09/29/2018 at 6:06 PM  Between 7am to 6pm - Pager - [phone]  After 6pm call admission pager [phone]  [facility] Office  [phone]  CC: Primary care physician; Dr. Enid Derry

## 2018-09-29 NOTE — ED Notes (Signed)
Date and time results received: 09/29/18 4:31 PM 0.05 troponin Test:  Critical Value:  Name of Provider Notified: Dr. Alfred Levins

## 2018-09-30 ENCOUNTER — Inpatient Hospital Stay: Payer: Medicare HMO

## 2018-09-30 DIAGNOSIS — N179 Acute kidney failure, unspecified: Secondary | ICD-10-CM | POA: Diagnosis present

## 2018-09-30 LAB — URINALYSIS, COMPLETE (UACMP) WITH MICROSCOPIC
BILIRUBIN URINE: NEGATIVE
Bacteria, UA: NONE SEEN
GLUCOSE, UA: NEGATIVE mg/dL
Hgb urine dipstick: NEGATIVE
KETONES UR: NEGATIVE mg/dL
LEUKOCYTES UA: NEGATIVE
NITRITE: NEGATIVE
PH: 6 (ref 5.0–8.0)
Protein, ur: 100 mg/dL — AB
Specific Gravity, Urine: 1.013 (ref 1.005–1.030)

## 2018-09-30 LAB — VITAMIN B12: Vitamin B-12: 765 pg/mL (ref 180–914)

## 2018-09-30 LAB — TROPONIN I: Troponin I: 0.04 ng/mL (ref <0.03)

## 2018-09-30 MED ORDER — ISOSORBIDE MONONITRATE ER 30 MG PO TB24: 30.0000 mg | ORAL_TABLET | Freq: Every day | ORAL | 1 refills | Status: DC

## 2018-09-30 NOTE — Care Management CC44 (Signed)
Condition Code 44 Documentation Completed  Patient Details  Name: Robert Hansen MRN: [id number] Date of Birth: 1940-12-01   Condition Code 44 given:  Yes Patient signature on Condition Code 44 notice:  Yes Documentation of 2 MD's agreement:  Yes Code 44 added to claim:  Yes    Latanya Maudlin, RN 09/30/2018, 1:50 PM

## 2018-09-30 NOTE — Care Management Obs Status (Signed)
[facility] NOTIFICATION   Patient Details  Name: Robert Hansen MRN: [id number] Date of Birth: May 20, 1941   Medicare Observation Status Notification Given:  Yes    Macklen Wilhoite A Waylen Depaolo, RN 09/30/2018, 1:50 PM

## 2018-09-30 NOTE — Progress Notes (Signed)
[place] [facility] [place] NAME: Robert Hansen    MR#:  [id number]  DATE OF BIRTH:  01-31-1941  SUBJECTIVE:   Came in with some tingling in the fingers bilateral hands and not feeling well. Not able to tell me much about his urine output. Flow better today. Denies any chest pain or shortness of breath REVIEW OF SYSTEMS:   Review of Systems  Constitutional: Negative for chills, fever and weight loss.  HENT: Negative for ear discharge, ear pain and nosebleeds.   Eyes: Negative for blurred vision, pain and discharge.  Respiratory: Negative for sputum production, shortness of breath, wheezing and stridor.   Cardiovascular: Negative for chest pain, palpitations, orthopnea and PND.  Gastrointestinal: Negative for abdominal pain, diarrhea, nausea and vomiting.  Genitourinary: Negative for frequency and urgency.  Musculoskeletal: Negative for back pain and joint pain.  Neurological: Positive for tingling. Negative for sensory change, speech change, focal weakness and weakness.  Psychiatric/Behavioral: Negative for depression and hallucinations. The patient is not nervous/anxious.    Tolerating Diet: yes Tolerating PT: ambulatory  DRUG ALLERGIES:   Allergies  Allergen Reactions  . Codeine Nausea And Vomiting  . Morphine Nausea Only    VITALS:  Blood pressure 131/86, pulse (!) 59, temperature 98 F (36.7 C), temperature source Oral, resp. rate 18, height 5\' 8"  (1.727 m), weight 79.8 kg, SpO2 97 %.  PHYSICAL EXAMINATION:   Physical Exam  GENERAL:  77 y.o.-year-old patient lying in the bed with no acute distress.  EYES: Pupils equal, round, reactive to light and accommodation. No scleral icterus. Extraocular muscles intact.  HEENT: Head atraumatic, normocephalic. Oropharynx and nasopharynx clear.  NECK:  Supple, no jugular venous distention. No thyroid enlargement, no tenderness.  LUNGS: Normal breath sounds bilaterally, no wheezing,  rales, rhonchi. No use of accessory muscles of respiration.  CARDIOVASCULAR: S1, S2 normal. No murmurs, rubs, or gallops.  ABDOMEN: Soft, nontender, nondistended. Bowel sounds present. No organomegaly or mass.  EXTREMITIES: No cyanosis, clubbing or edema b/l.    NEUROLOGIC: Cranial nerves II through XII are intact. No focal Motor or sensory deficits b/l.   PSYCHIATRIC:  patient is alert and oriented x 3.  SKIN: No obvious rash, lesion, or ulcer.   LABORATORY PANEL:  CBC Recent Labs  Lab 09/29/18 2331  WBC 6.9  HGB 11.3*  HCT 33.4*  PLT 152    Chemistries  Recent Labs  Lab 09/29/18 1521 09/29/18 2331  NA 142 141  K 3.6 3.3*  CL 109 111  CO2 24 19*  GLUCOSE 100* 105*  BUN 29* 28*  CREATININE 3.68* 3.63*  CALCIUM 8.8* 8.3*  AST 25  --   ALT 17  --   ALKPHOS 64  --   BILITOT 0.7  --    Cardiac Enzymes Recent Labs  Lab 09/29/18 2331  TROPONINI 0.04*   RADIOLOGY:  US Renal  Result Date: 09/30/2018 CLINICAL DATA:  Acute kidney injury; history coronary artery disease, colon cancer, diabetes mellitus, hypertension EXAM: RENAL / URINARY TRACT ULTRASOUND COMPLETE COMPARISON:  CT abdomen and pelvis 07/17/2015 FINDINGS: Right Kidney: Renal measurements: 11.0 x 6.6 x 6.2 cm = volume: 236 mL. Marked cortical thinning. Increased cortical echogenicity. No hydronephrosis or shadowing calcification. Two small peripelvic cysts identified, 13 x 13 x 10 mm and 12 x 21 x 14 mm, seen on previous exam as well. Left Kidney: Renal measurements: 10.5 x 4.9 x 4.4 cm = volume: 120 mL. Marked cortical thinning. Increased cortical echogenicity.  No mass, hydronephrosis or shadowing calcification. Bladder: Appears normal for degree of bladder distention. Prostate gland enlargement: 5.3 x 3.5 x 5.1 cm.  Volume 50 mL. IMPRESSION: Marked renal cortical atrophy and medical renal disease changes of both kidneys. Small peripelvic renal cysts are present bilaterally on the prior CT exam but only adequately  visualized at the RIGHT kidney on sonography. Significant prostatic enlargement. Electronically Signed   By: Lavonia Dana M.D.   On: 09/30/2018 08:49   ASSESSMENT AND PLAN:  Robert Hansen  is a 77 y.o. male with a known history of hypertension chronic kidney disease presents with legs sweating and cold.  His blood pressures been high.  His legs have been tingling.  He has been lightheaded.  Some leg pain.  In the ER he was found to have an elevated creatinine Of 3.68 with a GFR of 15.  1.  Acute kidney injury on chronic kidney disease stage IV.  - IV fluid hydration.  Advised that he must cut back on his soda. -  CPK is 88, TSH 1.81  and vitamin B12 pending -Creat 3.68--3.63 -baseline last creat 2.8 (2018) -consultation with Dr. Juleen China -renal ultrasound--- chronic kidney disease  2.  Hypertension.  Continue hydralazine Norvasc and add Imdur  3.  Hyperlipidemia unspecified.  resume statins. CPK Ok  4.  History of CAD on aspirin and Plavix  5.  History of type 2 diabetes mellitus.   - hemoglobin A1c 5.7  6.  Elevated troponin this is false positive with acute kidney injury.  Patient has no complaints of chest pain.  I -0.05--0.04--0.04 No chest pains   Case discussed with Care Management/Social Worker. Management plans discussed with the patient, family and they are in agreement.  CODE STATUS: full  DVT Prophylaxis: heparin  TOTAL TIME TAKING CARE OF THIS PATIENT: *27* minutes.  >50% time spent on counselling and coordination of care  POSSIBLE D/C IN 1-2 DAYS, DEPENDING ON CLINICAL CONDITION.  Note: This dictation was prepared with Dragon dictation along with smaller phrase technology. Any transcriptional errors that result from this process are unintentional.  Fritzi Mandes M.D on 09/30/2018 at 9:37 AM  Between 7am to 6pm - Pager - [phone]  After 6pm go to [email or web address] - password [email or web address] [facility]  Office  [phone]  CC: Primary care  physician; Roselee Nova, MDPatient ID: Robert Hansen, male   DOB: 05/25/1941, 77 y.o.   MRN: [id number]

## 2018-09-30 NOTE — Consult Note (Signed)
[facility]  CONSULT NOTE    Date: 09/30/2018                  Patient Name:  Robert Hansen  MRN: [id number]  DOB: [birth year]  Age / Sex: 77 y.o., male         PCP: Roselee Nova, MD                 Service Requesting Consult: Dr. Posey Pronto                 Reason for Consult: Acute renal failure on chronic kidney disease stage IV            History of Present Illness: Robert Hansen is a 77 y.o. white male with hypertension, coronary artery disease, hyperlipidemia, diet controlled diabetes, peripheral vascular disease, history of colon cancer status post resection, who was admitted to [facility] on 09/29/2018 for Elevated troponin [R79.89] Acute kidney injury ([place]) [N17.9] Essential hypertension [I10] Acute kidney injury superimposed on chronic kidney disease ([place]) [N17.9, N18.9]  Wife at bedside. Who assists with history taking. Patient was referred to nephrology in 2017 and 2018. However he did not make the appointment.   Patient claims to take all his medications as prescribed. His main complaint is peripheral neuropathy. Endorses some peripheral edema.   Denies use of nonsteroidal anti-inflammatory agents.    Medications: Outpatient medications: Medications Prior to Admission  Medication Sig Dispense Refill Last Dose  . amLODipine (NORVASC) 10 MG tablet Take 10 mg by mouth daily.   09/29/2018 at 0800  . aspirin 81 MG tablet Take 81 mg by mouth at bedtime.    09/28/2018 at 2000  . clopidogrel (PLAVIX) 75 MG tablet Take 75 mg by mouth daily.   09/29/2018 at 0800  . ezetimibe (ZETIA) 10 MG tablet Take 10 mg by mouth daily.   09/29/2018 at 0800  . hydrALAZINE (APRESOLINE) 100 MG tablet Take 100 mg by mouth 3 (three) times daily. Reported on 12/16/2015   09/29/2018 at 1200  . nitroGLYCERIN (NITROSTAT) 0.4 MG SL tablet Place 0.4 mg under the tongue every 5 (five) minutes as needed. Reported on 04/19/2016   prn at prn  . Omega-3 Fatty Acids (FISH OIL) 1200 MG  CPDR Take 1 capsule by mouth 2 (two) times daily.   09/29/2018 at 0800  . Pitavastatin Calcium (LIVALO) 2 MG TABS Take 2 mg by mouth daily.   09/28/2018 at 2000    Current medications: Current Facility-Administered Medications  Medication Dose Route Frequency Provider Last Rate Last Dose  . acetaminophen (TYLENOL) tablet 650 mg  650 mg Oral Q6H PRN Loletha Grayer, MD       Or  . acetaminophen (TYLENOL) suppository 650 mg  650 mg Rectal Q6H PRN Wieting, Richard, MD      . amLODipine (NORVASC) tablet 10 mg  10 mg Oral Daily Wieting, Richard, MD      . aspirin EC tablet 81 mg  81 mg Oral QHS Loletha Grayer, MD   81 mg at 09/29/18 2137  . clopidogrel (PLAVIX) tablet 75 mg  75 mg Oral Daily Wieting, Richard, MD      . ezetimibe (ZETIA) tablet 10 mg  10 mg Oral Daily Wieting, Richard, MD      . heparin injection 5,000 Units  5,000 Units Subcutaneous Q8H Loletha Grayer, MD   5,000 Units at 09/30/18 0932  . hydrALAZINE (APRESOLINE) tablet 100 mg  100 mg Oral TID Loletha Grayer,  MD   100 mg at 09/29/18 2137  . Influenza vac split quadrivalent PF (FLUZONE HIGH-DOSE) injection 0.5 mL  0.5 mL Intramuscular Tomorrow-1000 Wieting, Richard, MD      . isosorbide mononitrate (IMDUR) 24 hr tablet 30 mg  30 mg Oral Daily Loletha Grayer, MD   30 mg at 09/29/18 2138  . nitroGLYCERIN (NITROSTAT) SL tablet 0.4 mg  0.4 mg Sublingual Q5 min PRN Wieting, Richard, MD      . omega-3 acid ethyl esters (LOVAZA) capsule 1 g  1 capsule Oral BID Loletha Grayer, MD   1 g at 09/29/18 2137  . ondansetron (ZOFRAN) tablet 4 mg  4 mg Oral Q6H PRN Loletha Grayer, MD       Or  . ondansetron (ZOFRAN) injection 4 mg  4 mg Intravenous Q6H PRN Wieting, Richard, MD      . pneumococcal 23 valent vaccine (PNU-IMMUNE) injection 0.5 mL  0.5 mL Intramuscular Tomorrow-1000 Loletha Grayer, MD          Allergies: Allergies  Allergen Reactions  . Codeine Nausea And Vomiting  . Morphine Nausea Only      Past Medical  History: Past Medical History:  Diagnosis Date  . Allergy   . Arthritis   . Cancer ([place]) 2011   colon, SIGMOID COLON RESECTION: INVASIVE ADENOCARCINOMA  . Cholelithiasis   . Chronic kidney disease    CREAT 1.9 3 YEARS AGO  . Colon polyp 11/02/2011   tubular adenoma  . Coronary artery disease   . Diabetes mellitus without complication (HCC)    borderline  . GERD (gastroesophageal reflux disease)   . Heart murmur   . Hyperlipidemia   . Hypertension      Past Surgical History: Past Surgical History:  Procedure Laterality Date  . CHOLECYSTECTOMY N/A 04/19/2016   Procedure: LAPAROSCOPIC CHOLECYSTECTOMY;  Surgeon: Clayburn Pert, MD;  Location: [facility] ORS;  Service: General;  Laterality: N/A;  . COLON SURGERY  07/15/2010   Dr. Pat Patrick, [facility], SIGMOID COLON RESECTION:   . COLONOSCOPY  11/02/2011   Dr Pat Patrick  . COLONOSCOPY WITH PROPOFOL N/A 01/11/2017   Procedure: COLONOSCOPY WITH PROPOFOL;  Surgeon: Robert Bellow, MD;  Location: [facility] ENDOSCOPY;  Service: Endoscopy;  Laterality: N/A;  . CORONARY ANGIOPLASTY WITH STENT PLACEMENT  2000   Dr. Humphrey Rolls, [facility]     Family History: Family History  Problem Relation Age of Onset  . Diabetes Mother   . Breast cancer Mother        breast     Social History: Social History   Socioeconomic History  . Marital status: Married    Spouse name: Not on file  . Number of children: Not on file  . Years of education: Not on file  . Highest education level: Not on file  Occupational History  . Not on file  Social Needs  . Financial resource strain: Not on file  . Food insecurity:    Worry: Not on file    Inability: Not on file  . Transportation needs:    Medical: Not on file    Non-medical: Not on file  Tobacco Use  . Smoking status: Never Smoker  . Smokeless tobacco: Never Used  Substance and Sexual Activity  . Alcohol use: No    Alcohol/week: 0.0 standard drinks    Frequency: Never  . Drug use: No  . Sexual activity: Not Currently   Lifestyle  . Physical activity:    Days per week: Not on file    Minutes per  session: Not on file  . Stress: Not on file  Relationships  . Social connections:    Talks on phone: Not on file    Gets together: Not on file    Attends religious service: Not on file    Active member of club or organization: Not on file    Attends meetings of clubs or organizations: Not on file    Relationship status: Not on file  . Intimate partner violence:    Fear of current or ex partner: Not on file    Emotionally abused: Not on file    Physically abused: Not on file    Forced sexual activity: Not on file  Other Topics Concern  . Not on file  Social History Narrative  . Not on file     Review of Systems: Review of Systems  Constitutional: Negative.  Negative for chills, diaphoresis, fever, malaise/fatigue and weight loss.  HENT: Negative.  Negative for congestion, ear discharge, ear pain, hearing loss, nosebleeds, sinus pain, sore throat and tinnitus.   Eyes: Negative.  Negative for blurred vision, double vision, photophobia, pain, discharge and redness.  Respiratory: Negative.  Negative for cough, hemoptysis, sputum production, shortness of breath, wheezing and stridor.   Cardiovascular: Negative.  Negative for chest pain, palpitations, orthopnea, claudication, leg swelling and PND.  Gastrointestinal: Negative.  Negative for abdominal pain, blood in stool, constipation, diarrhea, heartburn, melena, nausea and vomiting.  Genitourinary: Negative.  Negative for dysuria, flank pain, frequency, hematuria and urgency.  Musculoskeletal: Negative.  Negative for back pain, falls, joint pain, myalgias and neck pain.  Skin: Negative.  Negative for itching and rash.  Neurological: Negative.  Negative for dizziness, tingling, tremors, sensory change, speech change, focal weakness, seizures, loss of consciousness, weakness and headaches.  Endo/Heme/Allergies: Negative.  Negative for environmental allergies  and polydipsia. Does not bruise/bleed easily.  Psychiatric/Behavioral: Negative.  Negative for depression, hallucinations, memory loss, substance abuse and suicidal ideas. The patient is not nervous/anxious and does not have insomnia.     Vital Signs: Blood pressure 131/86, pulse (!) 59, temperature 98 F (36.7 C), temperature source Oral, resp. rate 18, height 5\' 8"  (1.727 m), weight 79.8 kg, SpO2 97 %.  Weight trends: Filed Weights   09/29/18 1518  Weight: 79.8 kg    Physical Exam: General: NAD,   Head: Normocephalic, atraumatic. Moist oral mucosal membranes  Eyes: Anicteric, PERRL  Neck: Supple, trachea midline  Lungs:  Clear to auscultation  Heart: Regular rate and rhythm  Abdomen:  Soft, nontender,   Extremities: no peripheral edema.  Neurologic: Nonfocal, moving all four extremities  Skin: No lesions         Lab results: Basic Metabolic Panel: Recent Labs  Lab 09/29/18 1521 09/29/18 2331  NA 142 141  K 3.6 3.3*  CL 109 111  CO2 24 19*  GLUCOSE 100* 105*  BUN 29* 28*  CREATININE 3.68* 3.63*  CALCIUM 8.8* 8.3*    Liver Function Tests: Recent Labs  Lab 09/29/18 1521  AST 25  ALT 17  ALKPHOS 64  BILITOT 0.7  PROT 7.1  ALBUMIN 3.7   No results for input(s): LIPASE, AMYLASE in the last 168 hours. No results for input(s): AMMONIA in the last 168 hours.  CBC: Recent Labs  Lab 09/29/18 1521 09/29/18 2331  WBC 7.1 6.9  NEUTROABS 4.9  --   HGB 12.7* 11.3*  HCT 37.9* 33.4*  MCV 92.7 92.3  PLT 179 152    Cardiac Enzymes: Recent Labs  Lab 09/29/18  1521 09/29/18 1741 09/29/18 1844 09/29/18 2331  CKTOTAL  --  88  --   --   TROPONINI 0.05*  --  0.04* 0.04*    BNP: Invalid input(s): POCBNP  CBG: Recent Labs  Lab 09/29/18 [place]    Microbiology: No results found for this or any previous visit.  Coagulation Studies: No results for input(s): LABPROT, INR in the last 72 hours.  Urinalysis: Recent Labs    09/30/18 0459   COLORURINE YELLOW*  LABSPEC 1.013  PHURINE 6.0  GLUCOSEU NEGATIVE  HGBUR NEGATIVE  BILIRUBINUR NEGATIVE  KETONESUR NEGATIVE  PROTEINUR 100*  NITRITE NEGATIVE  LEUKOCYTESUR NEGATIVE      Imaging: US Renal  Result Date: 09/30/2018 CLINICAL DATA:  Acute kidney injury; history coronary artery disease, colon cancer, diabetes mellitus, hypertension EXAM: RENAL / URINARY TRACT ULTRASOUND COMPLETE COMPARISON:  CT abdomen and pelvis 07/17/2015 FINDINGS: Right Kidney: Renal measurements: 11.0 x 6.6 x 6.2 cm = volume: 236 mL. Marked cortical thinning. Increased cortical echogenicity. No hydronephrosis or shadowing calcification. Two small peripelvic cysts identified, 13 x 13 x 10 mm and 12 x 21 x 14 mm, seen on previous exam as well. Left Kidney: Renal measurements: 10.5 x 4.9 x 4.4 cm = volume: 120 mL. Marked cortical thinning. Increased cortical echogenicity. No mass, hydronephrosis or shadowing calcification. Bladder: Appears normal for degree of bladder distention. Prostate gland enlargement: 5.3 x 3.5 x 5.1 cm.  Volume 50 mL. IMPRESSION: Marked renal cortical atrophy and medical renal disease changes of both kidneys. Small peripelvic renal cysts are present bilaterally on the prior CT exam but only adequately visualized at the RIGHT kidney on sonography. Significant prostatic enlargement. Electronically Signed   By: Lavonia Dana M.D.   On: 09/30/2018 08:49      Assessment & Plan: [name] is a 77 y.o. white male with hypertension, coronary artery disease, hyperlipidemia, diet controlled diabetes, peripheral vascular disease, history of colon cancer status post resection, who was admitted to [facility] on 09/29/2018 for Elevated troponin [R79.89] Acute kidney injury ([place]) [N17.9] Essential hypertension [I10] Acute kidney injury superimposed on chronic kidney disease ([place]) [N17.9, N18.9]  1. Acute renal failure on chronic kidney disease stage IV versus progression of chronic kidney  disease With proteinuria Baseline creatinine of 2.7 GFR of 22 on 11/23/17 Most likely chronic kidney disease secondary to hypertension and/or diabetes. No history of NSAIDs Ultrasound reviewed, consistent with chronic kidney disease Not currently on an ACE-I/ARB With hypokalemia and metabolic acidosis - most likely from normal saline infusion.  - Discussed chronic kidney disease with patient and wife.   2. Hypertension: 131/86 - home regimen of hydralazine and amlodipine  Will need outpatient follow up with nephrology.   LOS: 1 Tannar Broker 11/2/201910:14 AM

## 2018-09-30 NOTE — Discharge Summary (Signed)
[place] [facility] [place] NAME: Robert Hansen    MR#:  [id number]  DATE OF BIRTH:  November 22, 1941  DATE OF ADMISSION:  09/29/2018 ADMITTING PHYSICIAN: Loletha Grayer, MD  DATE OF DISCHARGE: 09/30/2018  PRIMARY CARE PHYSICIAN: Arnetha Courser, MD    ADMISSION DIAGNOSIS:  Elevated troponin [R79.89] Acute kidney injury ([place]) [N17.9] Essential hypertension [I10] Acute kidney injury superimposed on chronic kidney disease ([place]) [N17.9, N18.9]  DISCHARGE DIAGNOSIS:  Acute on Chronic disease stage IV versus progression of chronic kidney disease Hypertension  SECONDARY DIAGNOSIS:   Past Medical History:  Diagnosis Date  . Allergy   . Arthritis   . Cancer ([place]) 2011   colon, SIGMOID COLON RESECTION: INVASIVE ADENOCARCINOMA  . Cholelithiasis   . Chronic kidney disease    CREAT 1.9 3 YEARS AGO  . Colon polyp 11/02/2011   tubular adenoma  . Coronary artery disease   . Diabetes mellitus without complication (HCC)    borderline  . GERD (gastroesophageal reflux disease)   . Heart murmur   . Hyperlipidemia   . Hypertension     HOSPITAL COURSE:  [name] is a 77 y.o. white male with hypertension, coronary artery disease, hyperlipidemia, diet controlled diabetes, peripheral vascular disease, history of colon cancer status post resection, who was admitted to [facility] on 09/29/2018 for Elevated creatinie.  1. Acute renal failure on chronic kidney disease stage IV versus progression of chronic kidney disease with proteinuria Baseline creatinine of 2.7 GFR of 22 on 11/23/17 Came in with creat of 3.6 Received IVF Most likely chronic kidney disease secondary to hypertension and/or diabetes. No history of NSAIDs Ultrasound reviewed, consistent with chronic kidney disease Not currently on an ACE-I/ARB - Discussed chronic kidney disease with patient and wife.  -appreciate Nephrology input. Patient will follow-up closely with nephrology as  outpatient.   2. Hypertension: 131/86 - home regimen of hydralazine and amlodipine -added imdur  3. Diabetes hemoglobin A1c is 5.7  discuss at length with patient and wife. Discharged to home outpatient close follow-up with nephrology.  CONSULTS OBTAINED:  Treatment Team:  Lavonia Dana, MD  DRUG ALLERGIES:   Allergies  Allergen Reactions  . Codeine Nausea And Vomiting  . Morphine Nausea Only    DISCHARGE MEDICATIONS:   Allergies as of 09/30/2018      Reactions   Codeine Nausea And Vomiting   Morphine Nausea Only      Medication List    TAKE these medications   amLODipine 10 MG tablet Commonly known as:  NORVASC Take 10 mg by mouth daily.   aspirin 81 MG tablet Take 81 mg by mouth at bedtime.   clopidogrel 75 MG tablet Commonly known as:  PLAVIX Take 75 mg by mouth daily.   ezetimibe 10 MG tablet Commonly known as:  ZETIA Take 10 mg by mouth daily.   Fish Oil 1200 MG Cpdr Take 1 capsule by mouth 2 (two) times daily.   hydrALAZINE 100 MG tablet Commonly known as:  APRESOLINE Take 100 mg by mouth 3 (three) times daily. Reported on 12/16/2015   isosorbide mononitrate 30 MG 24 hr tablet Commonly known as:  IMDUR Take 1 tablet (30 mg total) by mouth daily. Start taking on:  10/01/2018   LIVALO 2 MG Tabs Generic drug:  Pitavastatin Calcium Take 2 mg by mouth daily.   NITROSTAT 0.4 MG SL tablet Generic drug:  nitroGLYCERIN Place 0.4 mg under the tongue every 5 (five) minutes as needed. Reported  on 04/19/2016       If you experience worsening of your admission symptoms, develop shortness of breath, life threatening emergency, suicidal or homicidal thoughts you must seek medical attention immediately by calling 911 or calling your MD immediately  if symptoms less severe.  You Must read complete instructions/literature along with all the possible adverse reactions/side effects for all the Medicines you take and that have been prescribed to you. Take any  new Medicines after you have completely understood and accept all the possible adverse reactions/side effects.   Please note  You were cared for by a hospitalist during your hospital stay. If you have any questions about your discharge medications or the care you received while you were in the hospital after you are discharged, you can call the unit and asked to speak with the hospitalist on call if the hospitalist that took care of you is not available. Once you are discharged, your primary care physician will handle any further medical issues. Please note that NO REFILLS for any discharge medications will be authorized once you are discharged, as it is imperative that you return to your primary care physician (or establish a relationship with a primary care physician if you do not have one) for your aftercare needs so that they can reassess your need for medications and monitor your lab values.   DATA REVIEW:   CBC  Recent Labs  Lab 09/29/18 2331  WBC 6.9  HGB 11.3*  HCT 33.4*  PLT 152    Chemistries  Recent Labs  Lab 09/29/18 1521 09/29/18 2331  NA 142 141  K 3.6 3.3*  CL 109 111  CO2 24 19*  GLUCOSE 100* 105*  BUN 29* 28*  CREATININE 3.68* 3.63*  CALCIUM 8.8* 8.3*  AST 25  --   ALT 17  --   ALKPHOS 64  --   BILITOT 0.7  --     Microbiology Results   No results found for this or any previous visit (from the past 240 hour(s)).  RADIOLOGY:  US Renal  Result Date: 09/30/2018 CLINICAL DATA:  Acute kidney injury; history coronary artery disease, colon cancer, diabetes mellitus, hypertension EXAM: RENAL / URINARY TRACT ULTRASOUND COMPLETE COMPARISON:  CT abdomen and pelvis 07/17/2015 FINDINGS: Right Kidney: Renal measurements: 11.0 x 6.6 x 6.2 cm = volume: 236 mL. Marked cortical thinning. Increased cortical echogenicity. No hydronephrosis or shadowing calcification. Two small peripelvic cysts identified, 13 x 13 x 10 mm and 12 x 21 x 14 mm, seen on previous exam as well.  Left Kidney: Renal measurements: 10.5 x 4.9 x 4.4 cm = volume: 120 mL. Marked cortical thinning. Increased cortical echogenicity. No mass, hydronephrosis or shadowing calcification. Bladder: Appears normal for degree of bladder distention. Prostate gland enlargement: 5.3 x 3.5 x 5.1 cm.  Volume 50 mL. IMPRESSION: Marked renal cortical atrophy and medical renal disease changes of both kidneys. Small peripelvic renal cysts are present bilaterally on the prior CT exam but only adequately visualized at the RIGHT kidney on sonography. Significant prostatic enlargement. Electronically Signed   By: Lavonia Dana M.D.   On: 09/30/2018 08:49     Management plans discussed with the patient, family and they are in agreement.  CODE STATUS:     Code Status Orders  (From admission, onward)         Start     Ordered   09/29/18 1803  Full code  Continuous     09/29/18 1804  Code Status History    Date Active Date Inactive Code Status Order ID Comments User Context   03/15/2016 1208 03/16/2016 1357 Full Code [id number]  Clayburn Pert, MD Inpatient      TOTAL TIME TAKING CARE OF THIS PATIENT: 40* minutes.    Fritzi Mandes M.D on 09/30/2018 at 12:38 PM  Between 7am to 6pm - Pager - [phone] After 6pm go to [email or web address] - password [email or web address] [facility]  Office  [phone]  CC: Primary care physician; Arnetha Courser, MD

## 2018-10-02 ENCOUNTER — Ambulatory Visit (INDEPENDENT_AMBULATORY_CARE_PROVIDER_SITE_OTHER): Payer: Medicare HMO | Admitting: Nurse Practitioner

## 2018-10-02 ENCOUNTER — Encounter: Payer: Self-pay | Admitting: Nurse Practitioner

## 2018-10-02 VITALS — BP 150/80 | HR 82 | Temp 97.8°F | Resp 16 | Ht 65.25 in | Wt 175.4 lb

## 2018-10-02 DIAGNOSIS — R778 Other specified abnormalities of plasma proteins: Secondary | ICD-10-CM

## 2018-10-02 DIAGNOSIS — R7989 Other specified abnormal findings of blood chemistry: Secondary | ICD-10-CM | POA: Diagnosis not present

## 2018-10-02 DIAGNOSIS — R05 Cough: Secondary | ICD-10-CM

## 2018-10-02 DIAGNOSIS — N179 Acute kidney failure, unspecified: Secondary | ICD-10-CM

## 2018-10-02 DIAGNOSIS — H6123 Impacted cerumen, bilateral: Secondary | ICD-10-CM

## 2018-10-02 DIAGNOSIS — Z09 Encounter for follow-up examination after completed treatment for conditions other than malignant neoplasm: Secondary | ICD-10-CM | POA: Diagnosis not present

## 2018-10-02 DIAGNOSIS — I252 Old myocardial infarction: Secondary | ICD-10-CM

## 2018-10-02 DIAGNOSIS — I10 Essential (primary) hypertension: Secondary | ICD-10-CM | POA: Diagnosis not present

## 2018-10-02 DIAGNOSIS — R059 Cough, unspecified: Secondary | ICD-10-CM

## 2018-10-02 DIAGNOSIS — M7989 Other specified soft tissue disorders: Secondary | ICD-10-CM

## 2018-10-02 DIAGNOSIS — N189 Chronic kidney disease, unspecified: Secondary | ICD-10-CM

## 2018-10-02 DIAGNOSIS — M25422 Effusion, left elbow: Secondary | ICD-10-CM

## 2018-10-02 DIAGNOSIS — D649 Anemia, unspecified: Secondary | ICD-10-CM

## 2018-10-02 DIAGNOSIS — R0981 Nasal congestion: Secondary | ICD-10-CM

## 2018-10-02 DIAGNOSIS — R7303 Prediabetes: Secondary | ICD-10-CM

## 2018-10-02 DIAGNOSIS — R0609 Other forms of dyspnea: Secondary | ICD-10-CM

## 2018-10-02 NOTE — Progress Notes (Addendum)
Name: Robert Hansen   MRN: [id number]    DOB: [birth year]   Date:10/02/2018       Progress Note  Subjective  Chief Complaint  Chief Complaint  Patient presents with  . Nasal Congestion    he continues to have nasal drainage maybe related to sinus. Has been taking Mucinex and Cold and Flu. Hospitalized on Friday for kidney problems.     HPI  Patient here for nasal congestion and hospital follow-up Was admitted on 09/29/2018 and discharged on 09/30/2018 for acute on chronic kidney failure; improved with [facility]. Dr. Juleen China; nephrology saw patient inpatient and recommended outpatient follow up- has appointment for November 24th. Patient states has felt better overall; but nasal congestion uses flonase without relief also tried musinex without relief started about three weeks ago but seems to be worsening.  Patient denies NSAIDs States he has taken his blood pressure medications today; follows up with cardiology- Humphrey Rolls has appointment on the 18th for blood pressure; has been out of imdur but getting refilled today.  Noted to be anemic then; denies dark tarry stools. No blood in urine, no bleeding gums; just a a little bit of blood when blew his nose yesterday, none today.    Patient Active Problem List   Diagnosis Date Noted  . AKI (acute kidney injury) ([place]) 09/30/2018  . Acute kidney injury superimposed on CKD ([place]) 09/29/2018  . Elevated PSA 11/04/2016  . Annual physical exam 10/25/2016  . Nonrheumatic mitral valve insufficiency 05/04/2016  . Calculus of gallbladder with acute cholecystitis without obstruction   . Cancer of colon ([place]) 03/31/2016  . Arteriosclerosis of coronary artery 03/31/2016  . Atherosclerosis of coronary artery 03/31/2016  . CKD (chronic kidney disease) 03/31/2016  . Sex counseling 03/31/2016  . Essential (primary) hypertension 03/31/2016  . Acid reflux 03/31/2016  . H/O malignant neoplasm of colon 03/31/2016  . H/O acute myocardial infarction 03/31/2016  . BP  (high blood pressure) 03/31/2016  . Combined fat and carbohydrate induced hyperlipemia 03/31/2016  . Gallbladder calculus without cholecystitis and no obstruction 03/17/2016  . Cholecystitis 03/15/2016  . Hypertriglyceridemia 12/16/2015  . Elevated serum creatinine 12/16/2015  . Dizziness 12/16/2015  . Hyperglycemia 12/03/2015  . Biliary colic 63/11/6008  . Hypertension 07/15/2015  . Hyperlipidemia 07/15/2015  . Cardiac murmur 07/15/2015  . CAD S/P percutaneous coronary angioplasty 07/15/2015    Past Medical History:  Diagnosis Date  . Allergy   . Arthritis   . Cancer ([place]) 2011   colon, SIGMOID COLON RESECTION: INVASIVE ADENOCARCINOMA  . Cholelithiasis   . Chronic kidney disease    CREAT 1.9 3 YEARS AGO  . Colon polyp 11/02/2011   tubular adenoma  . Coronary artery disease   . Diabetes mellitus without complication (HCC)    borderline  . GERD (gastroesophageal reflux disease)   . Heart murmur   . Hyperlipidemia   . Hypertension     Past Surgical History:  Procedure Laterality Date  . CHOLECYSTECTOMY N/A 04/19/2016   Procedure: LAPAROSCOPIC CHOLECYSTECTOMY;  Surgeon: Clayburn Pert, MD;  Location: [facility] ORS;  Service: General;  Laterality: N/A;  . COLON SURGERY  07/15/2010   Dr. Pat Patrick, [facility], SIGMOID COLON RESECTION:   . COLONOSCOPY  11/02/2011   Dr Pat Patrick  . COLONOSCOPY WITH PROPOFOL N/A 01/11/2017   Procedure: COLONOSCOPY WITH PROPOFOL;  Surgeon: Robert Bellow, MD;  Location: [facility] ENDOSCOPY;  Service: Endoscopy;  Laterality: N/A;  . CORONARY ANGIOPLASTY WITH STENT PLACEMENT  2000   Dr. Humphrey Rolls, [place]  History   Tobacco Use  . Smoking status: Never Smoker  . Smokeless tobacco: Never Used  Substance Use Topics  . Alcohol use: No    Alcohol/week: 0.0 standard drinks    Frequency: Never     Current Outpatient Medications:  .  amLODipine (NORVASC) 10 MG tablet, Take 10 mg by mouth daily., Disp: , Rfl:  .  aspirin 81 MG tablet, Take 81 mg by mouth at  bedtime. , Disp: , Rfl:  .  clopidogrel (PLAVIX) 75 MG tablet, Take 75 mg by mouth daily., Disp: , Rfl:  .  ezetimibe (ZETIA) 10 MG tablet, Take 10 mg by mouth daily., Disp: , Rfl:  .  hydrALAZINE (APRESOLINE) 100 MG tablet, Take 100 mg by mouth 3 (three) times daily. Reported on 12/16/2015, Disp: , Rfl:  .  isosorbide mononitrate (IMDUR) 30 MG 24 hr tablet, Take 1 tablet (30 mg total) by mouth daily., Disp: 30 tablet, Rfl: 1 .  nitroGLYCERIN (NITROSTAT) 0.4 MG SL tablet, Place 0.4 mg under the tongue every 5 (five) minutes as needed. Reported on 04/19/2016, Disp: , Rfl:  .  Omega-3 Fatty Acids (FISH OIL) 1200 MG CPDR, Take 1 capsule by mouth 2 (two) times daily., Disp: , Rfl:  .  Pitavastatin Calcium (LIVALO) 2 MG TABS, Take 2 mg by mouth daily., Disp: , Rfl:   Allergies  Allergen Reactions  . Codeine Nausea And Vomiting  . Morphine Nausea Only    Review of Systems  Constitutional: Negative for chills and fever.  HENT: Positive for congestion, ear pain, nosebleeds, sinus pain and sore throat. Negative for tinnitus.   Eyes: Negative for blurred vision and double vision.  Respiratory: Positive for cough and sputum production (yellow- white). Negative for shortness of breath (with exertion, carrying something heavy- ongoing for years) and wheezing.   Cardiovascular: Negative for chest pain and palpitations.  Gastrointestinal: Negative for blood in stool, heartburn, nausea and vomiting.  Genitourinary: Negative for dysuria and hematuria.  Musculoskeletal: Negative for myalgias.  Skin: Negative for rash.  Neurological: Positive for tingling (bilateral lower extremity tingling). Negative for dizziness and headaches.  Psychiatric/Behavioral: The patient is not nervous/anxious.      No other specific complaints in a complete review of systems (except as listed in HPI above).  Objective  Vitals:   10/02/18 0936  BP: (!) 150/80  Pulse: 82  Resp: 16  Temp: 97.8 F (36.6 C)  TempSrc: Oral   SpO2: 99%  Weight: 175 lb 6.4 oz (79.6 kg)  Height: 5' 5.25" (1.657 m)    Body mass index is 28.96 kg/m.  Nursing Note and Vital Signs reviewed.  Physical Exam  Constitutional: He is oriented to person, place, and time. He appears well-developed and well-nourished.  HENT:  Head: Normocephalic and atraumatic.  Right Ear: Hearing, tympanic membrane, external ear and ear canal normal.  Left Ear: Hearing, tympanic membrane, external ear and ear canal normal. Left ear foreign body: initially bilateral cerumen impaction.  Nose: Mucosal edema present. Right sinus exhibits no maxillary sinus tenderness and no frontal sinus tenderness. Left sinus exhibits no maxillary sinus tenderness and no frontal sinus tenderness.  Mouth/Throat: Oropharynx is clear and moist and mucous membranes are normal.  Eyes: Pupils are equal, round, and reactive to light. Conjunctivae and EOM are normal.  Neck: Carotid bruit is not present.  Cardiovascular: Normal rate and regular rhythm.  Murmur heard. Pulses:      Radial pulses are 3+ on the right side, and 3+ on the left  side.       Dorsalis pedis pulses are 3+ on the right side, and 3+ on the left side.       Posterior tibial pulses are 3+ on the right side, and 3+ on the left side.  Bilateral lower extremity swelling, legs- pitting edema warm and dry  Pulmonary/Chest: Effort normal and breath sounds normal.  Abdominal: Soft. Bowel sounds are normal. There is no tenderness.  Musculoskeletal:       Left elbow: He exhibits effusion. He exhibits normal range of motion. No tenderness found.  Neurological: He is alert and oriented to person, place, and time. Coordination normal.  Skin: Skin is warm and dry. He is not diaphoretic. No erythema.  Psychiatric: He has a normal mood and affect. His behavior is normal. Judgment and thought content normal.     No results found for this or any previous visit (from the past 48 hour(s)).  Assessment & Plan 1. Hospital  discharge follow-up Medications reconciled. See AVS for further instructions.  - BASIC METABOLIC PANEL WITH GFR  2. Elevated troponin Decreased during hospitalization; asymptomatic   3. Acute kidney injury superimposed on CKD ([place]) Drinking plenty of water, avoid NSAIDs, discussed diet. recheck today; follow up with nephrology at the end of the month.  - BASIC METABOLIC PANEL WITH GFR  4. Essential (primary) hypertension Elevated today; managed by cardiology- sees them next week; will send note- considered ACEi, ARB - BASIC METABOLIC PANEL WITH GFR  5. Prediabetes Discussed diet in detail   6. Anemia, unspecified type Decreasing during hospitalization; no obvious source of bleeding. Given stool cards had history of colon cancer- will return in 2 weeks. USE CODE 55732 upon return sample.  - CBC with Differential - Fe+TIBC+Fer  7. Bilateral impacted cerumen - Ear Lavage  8. Nasal congestion Low dose antihistamine, and flonase, call if unimproved or worsening  9. Cough Low dose antihistamine, states is mild likely from PND  11. Elbow swelling, left Not painful, offered refferal for drainage- has a lot of appointments in the next month and its not painful   12. Dyspnea on exertion Consider CHF - B Nat Peptide  13. History of MI (myocardial infarction) - B Nat Peptide  -Red flags and when to present for emergency care or RTC including fever >101.40F, chest pain, shortness of breath, new/worsening/un-resolving symptoms,  reviewed with patient at time of visit. Follow up and care instructions discussed and provided in AVS.

## 2018-10-02 NOTE — Patient Instructions (Addendum)
- Please wear compression stocking during the day; elevate your legs at night.  - Please keep appointment with cardiologist and nephrologist  - Please return stool cards within the next 2 weeks. - take loratadine 5mg  daily for one week in addition to flonase. If not improving please call and tell us or if any point getting worse please let us know right away- we may need to start an antibiotic or refer your to ENT specialist.   Food Basics for Chronic Kidney Disease When your kidneys are not working well, they cannot remove waste and excess substances from your blood as effectively as they did before. This can lead to a buildup and imbalance of these substances, which can worsen kidney damage and affect how your body functions. Certain foods lead to a buildup of these substances in the body. By changing your diet as recommended by your diet and nutrition specialist (dietitian) or health care provider, you could help prevent further kidney damage and delay or prevent the need for dialysis. What are tips for following this plan? General instructions  Work with your health care provider and dietitian to develop a meal plan that is right for you. Foods you can eat, limit, or avoid will be different for each person depending on the stage of kidney disease and any other existing health conditions.  Talk with your health care provider about whether you should take a vitamin and mineral supplement.  Use standard measuring cups and spoons to measure servings of foods. Use a kitchen scale to measure portions of protein foods.  If directed by your health care provider, avoid drinking too much fluid. Measure and count all liquids, including water, ice, soups, flavored gelatin, and frozen desserts such as popsicles or ice cream. Reading food labels  Check the amount of sodium in foods. Choose foods that have less than 300 milligrams (mg) per serving.  Check the ingredient list for phosphorus or  potassium-based additives or preservatives.  Check the amount of saturated and trans fat. Limit or avoid these fats as told by your dietitian. Shopping  Avoid buying foods that are: ? Processed, frozen, or prepackaged. ? Calcium-enriched or fortified.  Do not buy foods that have salt or sodium listed among the first five ingredients.  Do not buy canned vegetables. Cooking  Replace animal proteins, such as meat, fish, eggs, or dairy, with plant proteins from beans, nuts, and soy. ? Use soy milk instead of cow's milk. ? Add beans or tofu to soups, casseroles, or pasta dishes instead of meat.  Soak vegetables, such as potatoes, before cooking to reduce potassium. To do this: ? Peel and cut into small pieces. ? Soak in warm water for at least 2 hours. For every 1 cup of vegetables, use 10 cups of water. ? Drain and rinse with warm water. ? Boil for at least 5 minutes. Meal planning  Limit the amount of protein from plant and animal sources you eat each day.  Do not add salt to food when cooking or before eating.  Eat meals and snacks at around the same time each day. If you have diabetes:  If you have diabetes (diabetes mellitus) and chronic kidney disease, it is important to keep your blood glucose in the target range recommended by your health care provider. Follow your diabetes management plan. This may include: ? Checking your blood glucose regularly. ? Taking oral medicines, insulin, or both. ? Exercising for at least 30 minutes on 5 or more days each week,  or as told by your health care provider. ? Tracking how many servings of carbohydrates you eat at each meal.  You may be given specific guidelines on how much of certain foods and nutrients you may eat, depending on your stage of kidney disease and whether you have high blood pressure (hypertension). Follow your meal plan as told by your dietitian. What nutrients should be limited? The items listed are not a complete list.  Talk with your dietitian about what dietary choices are best for you. Potassium Potassium affects how steadily your heart beats. If too much potassium builds up in your blood, it can cause an irregular heartbeat or even a heart attack. You may need to eat less potassium, depending on your blood potassium levels and the stage of kidney disease. Talk to your dietitian about how much potassium you may have each day. You may need to limit or avoid foods that are high in potassium, such as:  Milk and soy milk.  Fruits, such as bananas, papaya, apricots, nectarines, melon, prunes, raisins, kiwi, and oranges.  Vegetables, such as potatoes, sweet potatoes, yams, tomatoes, leafy greens, beets, okra, avocado, pumpkin, and winter squash.  White and lima beans.  Phosphorus Phosphorus is a mineral found in your bones. A balance between calcium and phosphorous is needed to build and maintain healthy bones. Too much phosphorus pulls calcium from your bones. This can make your bones weak and more likely to break. Too much phosphorus can also make your skin itch. You may need to eat less phosphorus depending on your blood phosphorus levels and the stage of kidney disease. Talk to your dietitian about how much potassium you may have each day. You may need to take medicine to lower your blood phosphorus levels if diet changes do not help. You may need to limit or avoid foods that are high in phosphorus, such as:  Milk and dairy products.  Dried beans and peas.  Tofu, soy milk, and other soy-based meat replacements.  Colas.  Nuts and peanut butter.  Meat, poultry, and fish.  Bran cereals and oatmeals.  Protein Protein helps you to make and keep muscle. It also helps in the repair of your body's cells and tissues. One of the natural breakdown products of protein is a waste product called urea. When your kidneys are not working properly, they cannot remove wastes, such as urea, like they did before you  developed chronic kidney disease. Reducing how much protein you eat can help prevent a buildup of urea in your blood. Depending on your stage of kidney disease, you may need to limit foods that are high in protein. Sources of animal protein include:  Meat (all types).  Fish and seafood.  Poultry.  Eggs.  Dairy.  Other protein foods include:  Beans and legumes.  Nuts and nut butter.  Soy and tofu.  Sodium Sodium, which is found in salt, helps maintain a healthy balance of fluids in your body. Too much sodium can increase your blood pressure and have a negative effect on the function of your heart and lungs. Too much sodium can also cause your body to retain too much fluid, making your kidneys work harder. Most people should have less than 2,300 milligrams (mg) of sodium each day. If you have hypertension, you may need to limit your sodium to 1,500 mg each day. Talk to your dietitian about how much sodium you may have each day. You may need to limit or avoid foods that are high in  sodium, such as:  Salt seasonings.  Soy sauce.  Cured and processed meats.  Salted crackers and snack foods.  Fast food.  Canned soups and most canned foods.  Pickled foods.  Vegetable juice.  Boxed mixes or ready-to-eat boxed meals and side dishes.  Bottled dressings, sauces, and marinades.  Summary  Chronic kidney disease can lead to a buildup and imbalance of waste and excess substances in the body. Certain foods lead to a buildup of these substances. By adjusting your intake of these foods, you could help prevent more kidney damage and delay or prevent the need for dialysis.  Food adjustments are different for each person with chronic kidney disease. Work with a dietitian to set up nutrient goals and a meal plan that is right for you.  If you have diabetes and chronic kidney disease, it is important to keep your blood glucose in the target range recommended by your health care  provider. This information is not intended to replace advice given to you by your health care provider. Make sure you discuss any questions you have with your health care provider. Document Released: 02/05/2003 Document Revised: 11/10/2016 Document Reviewed: 11/10/2016 Elsevier Interactive Patient Education  [employer].

## 2018-10-03 LAB — CBC WITH DIFFERENTIAL/PLATELET
Basophils Absolute: 50 cells/uL (ref 0–200)
Basophils Relative: 0.7 %
EOS ABS: 101 {cells}/uL (ref 15–500)
Eosinophils Relative: 1.4 %
HCT: 36.4 % — ABNORMAL LOW (ref 38.5–50.0)
Hemoglobin: 12.3 g/dL — ABNORMAL LOW (ref 13.2–17.1)
Lymphs Abs: 1116 cells/uL (ref 850–3900)
MCH: 31.2 pg (ref 27.0–33.0)
MCHC: 33.8 g/dL (ref 32.0–36.0)
MCV: 92.4 fL (ref 80.0–100.0)
MPV: 11 fL (ref 7.5–12.5)
Monocytes Relative: 7.9 %
Neutro Abs: 5364 cells/uL (ref 1500–7800)
Neutrophils Relative %: 74.5 %
PLATELETS: 195 10*3/uL (ref 140–400)
RBC: 3.94 10*6/uL — ABNORMAL LOW (ref 4.20–5.80)
RDW: 12.5 % (ref 11.0–15.0)
TOTAL LYMPHOCYTE: 15.5 %
WBC: 7.2 10*3/uL (ref 3.8–10.8)
WBCMIX: 569 {cells}/uL (ref 200–950)

## 2018-10-03 LAB — IRON,TIBC AND FERRITIN PANEL
%SAT: 35 % (ref 20–48)
Ferritin: 213 ng/mL (ref 24–380)
Iron: 86 ug/dL (ref 50–180)
TIBC: 249 mcg/dL (calc) — ABNORMAL LOW (ref 250–425)

## 2018-10-03 LAB — BASIC METABOLIC PANEL WITH GFR
BUN / CREAT RATIO: 9 (calc) (ref 6–22)
BUN: 32 mg/dL — AB (ref 7–25)
CALCIUM: 8.9 mg/dL (ref 8.6–10.3)
CHLORIDE: 106 mmol/L (ref 98–110)
CO2: 23 mmol/L (ref 20–32)
Creat: 3.51 mg/dL — ABNORMAL HIGH (ref 0.70–1.18)
GFR, EST AFRICAN AMERICAN: 19 mL/min/{1.73_m2} — AB (ref >=60)
GFR, Est Non African American: 16 mL/min/{1.73_m2} — ABNORMAL LOW (ref >=60)
GLUCOSE: 102 mg/dL (ref 65–139)
POTASSIUM: 3.6 mmol/L (ref 3.5–5.3)
Sodium: 140 mmol/L (ref 135–146)

## 2018-10-03 LAB — BRAIN NATRIURETIC PEPTIDE: Brain Natriuretic Peptide: 214 pg/mL — ABNORMAL HIGH (ref <100)

## 2018-10-24 ENCOUNTER — Ambulatory Visit (INDEPENDENT_AMBULATORY_CARE_PROVIDER_SITE_OTHER): Payer: Medicare HMO

## 2018-10-24 VITALS — BP 144/78 | HR 71 | Temp 97.9°F | Resp 16 | Ht 65.0 in | Wt 167.0 lb

## 2018-10-24 DIAGNOSIS — Z0000 Encounter for general adult medical examination without abnormal findings: Secondary | ICD-10-CM

## 2018-10-24 NOTE — Progress Notes (Signed)
Subjective:   Robert Hansen is a 77 y.o. male who presents for Medicare Annual/Subsequent preventive examination.  Review of Systems:   Cardiac Risk Factors include: advanced age (>92men, >38 women);hypertension;male gender;dyslipidemia     Objective:    Vitals: BP (!) 144/78 (BP Location: Left Arm, Patient Position: Sitting, Cuff Size: Normal)   Pulse 71   Temp 97.9 F (36.6 C) (Oral)   Resp 16   Ht 5\' 5"  (1.651 m)   Wt 167 lb (75.8 kg)   SpO2 96%   BMI 27.79 kg/m   Body mass index is 27.79 kg/m.  Advanced Directives 10/24/2018 09/29/2018 09/27/2017 10/25/2016 04/09/2016 04/01/2016 03/15/2016  Does Patient Have a Medical Advance Directive? No No No No No No No  Would patient like information on creating a medical advance directive? No - Patient declined No - Patient declined No - Patient declined - No - patient declined information - Yes - Scientist, clinical (histocompatibility and immunogenetics) given    Tobacco Social History   Tobacco Use  Smoking Status Never Smoker  Smokeless Tobacco Never Used     Counseling given: Not Answered   Clinical Intake:  Pre-visit preparation completed: Yes  Pain : No/denies pain     Nutritional Status: BMI 25 -29 Overweight Diabetes: No  How often do you need to have someone help you when you read instructions, pamphlets, or other written materials from your doctor or pharmacy?: 1 - Never What is the last grade level you completed in school?: 10th grade  Interpreter Needed?: No  Information entered by :: Clemetine Marker LPN  Past Medical History:  Diagnosis Date  . Allergy   . Arthritis   . 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 Cholelithiasis   . Chronic kidney disease    CREAT 1.9 3 YEARS AGO  . Colon polyp 11/02/2011   tubular adenoma  . Coronary artery disease   . Diabetes mellitus without complication (HCC)    borderline  . GERD (gastroesophageal reflux disease)   . Heart murmur   . Hyperlipidemia   .  Hypertension    Past Surgical History:  Procedure Laterality Date  . CHOLECYSTECTOMY N/A 04/19/2016   Procedure: LAPAROSCOPIC CHOLECYSTECTOMY;  Surgeon: Clayburn Pert, MD;  Location: [facility] ORS;  Service: General;  Laterality: N/A;  . COLON SURGERY  07/15/2010   Dr. Pat Patrick, [facility], SIGMOID COLON RESECTION:   . COLONOSCOPY  11/02/2011   Dr Pat Patrick  . COLONOSCOPY WITH PROPOFOL N/A 01/11/2017   Procedure: COLONOSCOPY WITH PROPOFOL;  Surgeon: Robert Bellow, MD;  Location: [facility] ENDOSCOPY;  Service: Endoscopy;  Laterality: N/A;  . CORONARY ANGIOPLASTY WITH STENT PLACEMENT  2000   Dr. Humphrey Rolls, [facility]   Family History  Problem Relation Age of Onset  . Diabetes Mother   . Breast cancer Mother        breast  . Heart disease Mother    Social History   Socioeconomic History  . Marital status: Married    Spouse name: Not on file  . Number of children: 4  . Years of education: Not on file  . Highest education level: 10th grade  Occupational History  . Not on file  Social Needs  . Financial resource strain: Not very hard  . Food insecurity:    Worry: Never true    Inability: Never true  . Transportation needs:    Medical: No    Non-medical: No  Tobacco Use  . Smoking status: Never Smoker  . Smokeless  tobacco: Never Used  Substance and Sexual Activity  . Alcohol use: No    Alcohol/week: 0.0 standard drinks    Frequency: Never  . Drug use: No  . Sexual activity: Not Currently  Lifestyle  . Physical activity:    Days per week: 0 days    Minutes per session: 0 min  . Stress: Not at all  Relationships  . Social connections:    Talks on phone: More than three times a week    Gets together: More than three times a week    Attends religious service: Never    Active member of club or organization: Yes    Attends meetings of clubs or organizations: 1 to 4 times per year    Relationship status: Married  Other Topics Concern  . 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 amLODipine (NORVASC) 10 MG tablet Take 10 mg by mouth daily.  Marland Kitchen aspirin 81 MG tablet Take 81 mg by mouth at bedtime.   . clopidogrel (PLAVIX) 75 MG tablet Take 75 mg by mouth daily.  Marland Kitchen ezetimibe (ZETIA) 10 MG tablet Take 10 mg by mouth daily.  . hydrALAZINE (APRESOLINE) 100 MG tablet Take 100 mg by mouth 3 (three) times daily. Reported on 12/16/2015  . isosorbide mononitrate (IMDUR) 30 MG 24 hr tablet Take 1 tablet (30 mg total) by mouth daily.  . nitroGLYCERIN (NITROSTAT) 0.4 MG SL tablet Place 0.4 mg under the tongue every 5 (five) minutes as needed. Reported on 04/19/2016  . Omega-3 Fatty Acids (FISH OIL) 1200 MG CPDR Take 1 capsule by mouth 2 (two) times daily.  . Pitavastatin Calcium (LIVALO) 2 MG TABS Take 2 mg by mouth daily.   No facility-administered encounter medications on file as of 10/24/2018.     Activities of Daily Living In your present state of health, do you have any difficulty performing the following activities: 10/24/2018 09/29/2018  Hearing? N N  Comment declines hearing aids -  Vision? N N  Comment wears reading glasses -  Difficulty concentrating or making decisions? N N  Walking or climbing stairs? N N  Dressing or bathing? N N  Doing errands, shopping? N N  Preparing Food and eating ? N -  Using the Toilet? N -  In the past six months, have you accidently leaked urine? N -  Do you have problems with loss of bowel control? N -  Managing your Medications? N -  Managing your Finances? N -  Housekeeping or managing your Housekeeping? N -  Some recent data might be hidden    Patient Care Team: Lada, Satira Anis, MD as PCP - General (Family Medicine) Dionisio David, MD as Consulting Physician (Cardiology) Lavonia Dana, MD as Consulting Physician (Nephrology)   Assessment:   This is a routine wellness examination for [place].  Exercise Activities and  Dietary recommendations Current Exercise Habits: The patient does not participate in regular exercise at present, Exercise limited by: cardiac condition(s)  Goals    . Increase water intake     Starting 10/25/16, I will increase my water intake to 4 glasses a day.    . Increase water intake     Recommend to increase fluid intake to 4-6 glasses per day       Fall Risk Fall Risk  10/24/2018 11/23/2017 09/27/2017 10/25/2016 12/16/2015  Falls in the past year? 0 No No No No  Number falls in past yr: 0 - - - -   FALL RISK PREVENTION PERTAINING TO THE HOME:  Any stairs in or around the home WITH handrails? Yes  Home free of loose throw rugs in walkways, pet beds, electrical cords, etc? Yes  Adequate lighting in your home to reduce risk of falls? Yes   ASSISTIVE DEVICES UTILIZED TO PREVENT FALLS:  Life alert? No  Use of a cane, walker or w/c? No  Grab bars in the bathroom? No  Shower chair or bench in shower? No  Elevated toilet seat or a handicapped toilet? No   DME ORDERS:  DME order needed?  No   TIMED UP AND GO:  Was the test performed? Yes .  Length of time to ambulate 10 feet: 6 sec.   GAIT:  Appearance of gait: Gait stead-fast and without the use of an assistive device.  Education: Fall risk prevention has been discussed.  Intervention(s) required? No   Depression Screen PHQ 2/9 Scores 10/24/2018 11/23/2017 09/27/2017 10/25/2016  PHQ - 2 Score 0 0 0 0    Cognitive Function pt declines 6CIT     6CIT Screen 10/25/2016  What Year? 0 points  What month? 0 points  What time? 0 points  Count back from 20 2 points  Months in reverse 0 points  Repeat phrase 0 points  Total Score 2    Immunization History  Administered Date(s) Administered  . Influenza, High Dose Seasonal PF 09/30/2018  . Pneumococcal Polysaccharide-23 09/30/2018    Qualifies for Shingles Vaccine? Yes . Due for Shingrix. Education has been provided regarding the importance of this vaccine.  Pt has been advised to call insurance company to determine out of pocket expense. Advised may also receive vaccine at local pharmacy or Health Dept. Verbalized acceptance and understanding.  Tdap: Although this vaccine is not a covered service during a Wellness Exam, does the patient still wish to receive this vaccine today?  No .  Education has been provided regarding the importance of this vaccine. Advised may receive this vaccine at local pharmacy or Health Dept. Aware to provide a copy of the vaccination record if obtained from local pharmacy or Health Dept. Verbalized acceptance and understanding.  Flu Vaccine: Up to date   Pneumococcal Vaccine: Due for Pneumococcal vaccine in 2020.   Screening Tests Health Maintenance  Topic Date Due  . TETANUS/TDAP  10/25/2026 (Originally 03/12/1960)  . PNA vac Low Risk Adult (2 of 2 - PCV13) 10/01/2019  . COLONOSCOPY  01/12/2020  . INFLUENZA VACCINE  Completed   Cancer Screenings:  Colorectal Screening: Completed 01/11/17. Repeat every 3 years;    Lung Cancer Screening: (Low Dose CT Chest recommended if Age 22-80 years, 30 pack-year currently smoking OR have quit w/in 15years.) does not qualify.    Additional Screening:  Hepatitis C Screening: no longer required.   Vision Screening: Recommended annual ophthalmology exams for early detection of glaucoma and other disorders of the eye. Is the patient up to date with their annual eye exam?  No  Who is the provider or what is the name of the office in which the pt attends annual eye exams? [facility]. Phone number provided to schedule appt and offered assistance to schedule if needed.    Dental Screening: Recommended annual dental exams for proper oral hygiene  Community Resource Referral:  CRR required this visit?  No       Plan:  I have personally reviewed and addressed the Medicare Annual Wellness questionnaire and have noted the following in the patient's chart:   A. Medical and social history B. Use of alcohol, tobacco or illicit drugs  C. Current medications and supplements D. Functional ability and status E.  Nutritional status F.  Physical activity G. Advance directives H. List of other physicians I.  Hospitalizations, surgeries, and ER visits in previous 12 months J.  [place] such as hearing and vision if needed, cognitive and depression L. Referrals and appointments   In addition, I have reviewed and discussed with patient certain preventive protocols, quality metrics, and best practice recommendations. A written personalized care plan for preventive services as well as general preventive health recommendations were provided to patient.   Signed,  Clemetine Marker, LPN Nurse Health Advisor   Nurse Notes:pt scheduled to follow up with Dr. Sanda Klein 11/02/18

## 2018-10-24 NOTE — Patient Instructions (Signed)
[name] , Thank you for taking time to come for your Medicare Wellness Visit. I appreciate your ongoing commitment to your health goals. Please review the following plan we discussed and let me know if I can assist you in the future.   Screening recommendations/referrals: Colonoscopy: done 01/11/17 repeat in 2021 Recommended yearly ophthalmology/optometry visit for glaucoma screening and checkup. Please call [facility] to schedule an appointment for eye exam at [phone]. Recommended yearly dental visit for hygiene and checkup  Vaccinations: Influenza vaccine: done 09/30/18 Pneumococcal vaccine: done 09/30/18 Tdap vaccine: please call us if you get a cut or scrape to administer this vaccine.  Shingles vaccine: Shingrix discussed. Please contact your insurance company for coverage information.     Advanced directives: Advance directive discussed with you today. Even though you declined this today please call our office should you change your mind and we can give you the proper paperwork for you to fill out.  Conditions/risks identified: Recommend increasing water intake to 4-5 glasses of water per day. Continue low sodium diet.   Next appointment: 11/02/18 9:00 Dr. Sanda Klein  Preventive Care 7 Years and Older, Male Preventive care refers to lifestyle choices and visits with your health care provider that can promote health and wellness. What does preventive care include?  A yearly physical exam. This is also called an annual well check.  Dental exams once or twice a year.  Routine eye exams. Ask your health care provider how often you should have your eyes checked.  Personal lifestyle choices, including:  Daily care of your teeth and gums.  Regular physical activity.  Eating a healthy diet.  Avoiding tobacco and drug use.  Limiting alcohol use.  Practicing safe sex.  Taking low doses of aspirin every day.  Taking vitamin and mineral supplements as recommended by  your health care provider. What happens during an annual well check? The services and screenings done by your health care provider during your annual well check will depend on your age, overall health, lifestyle risk factors, and family history of disease. Counseling  Your health care provider may ask you questions about your:  Alcohol use.  Tobacco use.  Drug use.  Emotional well-being.  Home and relationship well-being.  Sexual activity.  Eating habits.  History of falls.  Memory and ability to understand (cognition).  Work and work Statistician. Screening  You may have the following tests or measurements:  Height, weight, and BMI.  Blood pressure.  Lipid and cholesterol levels. These may be checked every 5 years, or more frequently if you are over 48 years old.  Skin check.  Lung cancer screening. You may have this screening every year starting at age 39 if you have a 30-pack-year history of smoking and currently smoke or have quit within the past 15 years.  Fecal occult blood test (FOBT) of the stool. You may have this test every year starting at age 84.  Flexible sigmoidoscopy or colonoscopy. You may have a sigmoidoscopy every 5 years or a colonoscopy every 10 years starting at age 80.  Prostate cancer screening. Recommendations will vary depending on your family history and other risks.  Hepatitis C blood test.  Hepatitis B blood test.  Sexually transmitted disease (STD) testing.  Diabetes screening. This is done by checking your blood sugar (glucose) after you have not eaten for a while (fasting). You may have this done every 1-3 years.  Abdominal aortic aneurysm (AAA) screening. You may need this if you are a current  or former smoker.  Osteoporosis. You may be screened starting at age 56 if you are at high risk. Talk with your health care provider about your test results, treatment options, and if necessary, the need for more tests. Vaccines  Your  health care provider may recommend certain vaccines, such as:  Influenza vaccine. This is recommended every year.  Tetanus, diphtheria, and acellular pertussis (Tdap, Td) vaccine. You may need a Td booster every 10 years.  Zoster vaccine. You may need this after age 30.  Pneumococcal 13-valent conjugate (PCV13) vaccine. One dose is recommended after age 65.  Pneumococcal polysaccharide (PPSV23) vaccine. One dose is recommended after age 75. Talk to your health care provider about which screenings and vaccines you need and how often you need them. This information is not intended to replace advice given to you by your health care provider. Make sure you discuss any questions you have with your health care provider. Document Released: 12/12/2015 Document Revised: 08/04/2016 Document Reviewed: 09/16/2015 Elsevier Interactive Patient Education  2017 [place] Prevention in the Home Falls can cause injuries. They can happen to people of all ages. There are many things you can do to make your home safe and to help prevent falls. What can I do on the outside of my home?  Regularly fix the edges of walkways and driveways and fix any cracks.  Remove anything that might make you trip as you walk through a door, such as a raised step or threshold.  Trim any bushes or trees on the path to your home.  Use bright outdoor lighting.  Clear any walking paths of anything that might make someone trip, such as rocks or tools.  Regularly check to see if handrails are loose or broken. Make sure that both sides of any steps have handrails.  Any raised decks and porches should have guardrails on the edges.  Have any leaves, snow, or ice cleared regularly.  Use sand or salt on walking paths during winter.  Clean up any spills in your garage right away. This includes oil or grease spills. What can I do in the bathroom?  Use night lights.  Install grab bars by the toilet and in the tub and  shower. Do not use towel bars as grab bars.  Use non-skid mats or decals in the tub or shower.  If you need to sit down in the shower, use a plastic, non-slip stool.  Keep the floor dry. Clean up any water that spills on the floor as soon as it happens.  Remove soap buildup in the tub or shower regularly.  Attach bath mats securely with double-sided non-slip rug tape.  Do not have throw rugs and other things on the floor that can make you trip. What can I do in the bedroom?  Use night lights.  Make sure that you have a light by your bed that is easy to reach.  Do not use any sheets or blankets that are too big for your bed. They should not hang down onto the floor.  Have a firm chair that has side arms. You can use this for support while you get dressed.  Do not have throw rugs and other things on the floor that can make you trip. What can I do in the kitchen?  Clean up any spills right away.  Avoid walking on wet floors.  Keep items that you use a lot in easy-to-reach places.  If you need to reach something above you,  use a strong step stool that has a grab bar.  Keep electrical cords out of the way.  Do not use floor polish or wax that makes floors slippery. If you must use wax, use non-skid floor wax.  Do not have throw rugs and other things on the floor that can make you trip. What can I do with my stairs?  Do not leave any items on the stairs.  Make sure that there are handrails on both sides of the stairs and use them. Fix handrails that are broken or loose. Make sure that handrails are as long as the stairways.  Check any carpeting to make sure that it is firmly attached to the stairs. Fix any carpet that is loose or worn.  Avoid having throw rugs at the top or bottom of the stairs. If you do have throw rugs, attach them to the floor with carpet tape.  Make sure that you have a light switch at the top of the stairs and the bottom of the stairs. If you do not  have them, ask someone to add them for you. What else can I do to help prevent falls?  Wear shoes that:  Do not have high heels.  Have rubber bottoms.  Are comfortable and fit you well.  Are closed at the toe. Do not wear sandals.  If you use a stepladder:  Make sure that it is fully opened. Do not climb a closed stepladder.  Make sure that both sides of the stepladder are locked into place.  Ask someone to hold it for you, if possible.  Clearly mark and make sure that you can see:  Any grab bars or handrails.  First and last steps.  Where the edge of each step is.  Use tools that help you move around (mobility aids) if they are needed. These include:  Canes.  Walkers.  Scooters.  Crutches.  Turn on the lights when you go into a dark area. Replace any light bulbs as soon as they burn out.  Set up your furniture so you have a clear path. Avoid moving your furniture around.  If any of your floors are uneven, fix them.  If there are any pets around you, be aware of where they are.  Review your medicines with your doctor. Some medicines can make you feel dizzy. This can increase your chance of falling. Ask your doctor what other things that you can do to help prevent falls. This information is not intended to replace advice given to you by your health care provider. Make sure you discuss any questions you have with your health care provider. Document Released: 09/11/2009 Document Revised: 04/22/2016 Document Reviewed: 12/20/2014 Elsevier Interactive Patient Education  2017 [employer].

## 2018-11-02 ENCOUNTER — Ambulatory Visit (INDEPENDENT_AMBULATORY_CARE_PROVIDER_SITE_OTHER): Payer: Medicare HMO | Admitting: Family Medicine

## 2018-11-02 ENCOUNTER — Other Ambulatory Visit: Payer: Self-pay | Admitting: Family Medicine

## 2018-11-02 ENCOUNTER — Encounter: Payer: Self-pay | Admitting: Family Medicine

## 2018-11-02 DIAGNOSIS — I10 Essential (primary) hypertension: Secondary | ICD-10-CM | POA: Diagnosis not present

## 2018-11-02 DIAGNOSIS — I2510 Atherosclerotic heart disease of native coronary artery without angina pectoris: Secondary | ICD-10-CM

## 2018-11-02 DIAGNOSIS — E782 Mixed hyperlipidemia: Secondary | ICD-10-CM

## 2018-11-02 DIAGNOSIS — N183 Chronic kidney disease, stage 3 unspecified: Secondary | ICD-10-CM

## 2018-11-02 DIAGNOSIS — Z9861 Coronary angioplasty status: Principal | ICD-10-CM

## 2018-11-02 DIAGNOSIS — R7303 Prediabetes: Secondary | ICD-10-CM

## 2018-11-02 DIAGNOSIS — R972 Elevated prostate specific antigen [PSA]: Secondary | ICD-10-CM

## 2018-11-02 LAB — LIPID PANEL
CHOLESTEROL: 122 mg/dL (ref <200)
HDL: 45 mg/dL (ref >40)
LDL Cholesterol (Calc): 58 mg/dL (calc)
Non-HDL Cholesterol (Calc): 77 mg/dL (calc) (ref <130)
TRIGLYCERIDES: 109 mg/dL (ref <150)
Total CHOL/HDL Ratio: 2.7 (calc) (ref <5.0)

## 2018-11-02 NOTE — Progress Notes (Signed)
Order lipids

## 2018-11-03 ENCOUNTER — Other Ambulatory Visit: Payer: Self-pay | Admitting: Family Medicine

## 2018-11-03 MED ORDER — PITAVASTATIN CALCIUM 2 MG PO TABS: 2.0000 mg | ORAL_TABLET | Freq: Every day | ORAL | 3 refills | Status: AC

## 2018-11-03 NOTE — Progress Notes (Signed)
Robert Hansen, please let the patient know that I am so impressed with the improvement in his cholesterol panel! He brought his total cholesterol 90 points, and he brought his "bad" cholesterol down 80 points. That is simply fantastic! Keep up the great job. We'll continue the medicine. I sent a year's worth of refills to his local pharmacy. Thank you

## 2018-11-03 NOTE — Progress Notes (Signed)
Refilled livalo

## 2018-11-06 MED ORDER — ISOSORBIDE MONONITRATE ER 30 MG PO TB24: 30.0000 mg | ORAL_TABLET | Freq: Every day | ORAL | 3 refills | Status: DC

## 2018-11-06 MED ORDER — HYDRALAZINE HCL 100 MG PO TABS: 100.0000 mg | ORAL_TABLET | Freq: Three times a day (TID) | ORAL | 3 refills | Status: AC

## 2018-11-12 NOTE — Patient Instructions (Signed)
**   no after visit summary could be given because the computers were down at the time of the visit **

## 2018-11-12 NOTE — Progress Notes (Signed)
BP 124/62   Pulse 65   Temp (!) 97.3 F (36.3 C) (Oral)   Ht 5\' 5"  (1.651 m)   Wt 161 lb 8 oz (73.3 kg)   SpO2 99%   BMI 26.88 kg/m    Subjective:    Patient ID: Robert Hansen, male    DOB: 07/31/1941, 77 y.o.   MRN: [id number]  HPI: Robert Hansen is a 77 y.o. male  Chief Complaint  Patient presents with  . Follow-up    HPI Patient is here for f/u; please note that the computers were down at the time of his visit, so things were entered after the fact  He has HTN, which is well-controlled today; he does not add much salt to his food; BP was around 140/75 at Dr. Assunta Gambles office; he saw his kidney doctor yestrday for his CKD; Dr. Juleen China did blood work and urine testing; I do not have access to those results; patient's mother had kidney disease and was on dialysis  Coronary artery disease; patient sees Dr. Humphrey Rolls, cardiologist; patient denies chest pain and leg edema; he wears compression stockings and limits his salt intake; he is on a long-acting nitrate; he his status post coronary artery stenting x 1  He has a hx of colon cancer; last colonoscopy was done by the surgeon, Dr. Fleet Contras, in 2018; he underwent surgery (resection and reanastomosis) plus chemo; he denies any blood in his stools  He has an elevated PSA; patient says the kidney doctor said that was "logical" and did not think he needed to see a urologist  Prediabetes; little bit of dry mouth, but uses paste for his dentures which causes that; last A1c was 5.7  ROS: some weight loss, but changed his eating because of his kidneys; let meat, more grilled chicken and salads, so he's not surprised, thinks it is from healthier eating  Meds reviewed; on aspirin plus plavix and no bleeding  Depression screen [facility] 2/9 11/02/2018 10/24/2018 11/23/2017 09/27/2017 10/25/2016  Decreased Interest 0 0 0 0 0  Down, Depressed, Hopeless 0 0 0 0 0  PHQ - 2 Score 0 0 0 0 0  Altered sleeping 0 - - - -  Tired, decreased energy 0 -  - - -  Change in appetite 0 - - - -  Feeling bad or failure about yourself  0 - - - -  Trouble concentrating 0 - - - -  Moving slowly or fidgety/restless 0 - - - -  Suicidal thoughts 0 - - - -  PHQ-9 Score 0 - - - -  Difficult doing work/chores Not difficult at all - - - -   Fall Risk  11/02/2018 10/24/2018 11/23/2017 09/27/2017 10/25/2016  Falls in the past year? 0 0 No No No  Number falls in past yr: - 0 - - -    Relevant past medical, surgical, family and social history reviewed Past Medical History:  Diagnosis Date  . Allergy   . Arthritis   . Cancer ([place]) 2011   colon, SIGMOID COLON RESECTION: INVASIVE ADENOCARCINOMA  . Cholelithiasis   . Chronic kidney disease    CREAT 1.9 3 YEARS AGO  . Colon polyp 11/02/2011   tubular adenoma  . Coronary artery disease   . Diabetes mellitus without complication (HCC)    borderline  . GERD (gastroesophageal reflux disease)   . Heart murmur   . Hyperlipidemia   . Hypertension    Past Surgical History:  Procedure Laterality Date  .  CHOLECYSTECTOMY N/A 04/19/2016   Procedure: LAPAROSCOPIC CHOLECYSTECTOMY;  Surgeon: Clayburn Pert, MD;  Location: [facility] ORS;  Service: General;  Laterality: N/A;  . COLON SURGERY  07/15/2010   Dr. Pat Patrick, [facility], SIGMOID COLON RESECTION:   . COLONOSCOPY  11/02/2011   Dr Pat Patrick  . COLONOSCOPY WITH PROPOFOL N/A 01/11/2017   Procedure: COLONOSCOPY WITH PROPOFOL;  Surgeon: Robert Bellow, MD;  Location: [facility] ENDOSCOPY;  Service: Endoscopy;  Laterality: N/A;  . CORONARY ANGIOPLASTY WITH STENT PLACEMENT  2000   Dr. Humphrey Rolls, [facility]   Family History  Problem Relation Age of Onset  . Diabetes Mother   . Breast cancer Mother        breast  . Heart disease Mother    Social History   Tobacco Use  . Smoking status: Never Smoker  . Smokeless tobacco: Never Used  Substance Use Topics  . Alcohol use: No    Alcohol/week: 0.0 standard drinks    Frequency: Never  . Drug use: No     Office Visit from 11/02/2018 in [facility]  AUDIT-C Score  0      Interim medical history since last visit reviewed. Allergies and medications reviewed  Review of Systems Per HPI unless specifically indicated above     Objective:    BP 124/62   Pulse 65   Temp (!) 97.3 F (36.3 C) (Oral)   Ht 5\' 5"  (1.651 m)   Wt 161 lb 8 oz (73.3 kg)   SpO2 99%   BMI 26.88 kg/m   Wt Readings from Last 3 Encounters:  11/02/18 161 lb 8 oz (73.3 kg)  10/24/18 167 lb (75.8 kg)  10/02/18 175 lb 6.4 oz (79.6 kg)    Physical Exam Constitutional:      General: He is not in acute distress.    Appearance: Normal appearance. He is well-developed.  HENT:     Head: Normocephalic and atraumatic.  Eyes:     General: No scleral icterus. Neck:     Thyroid: No thyromegaly.  Cardiovascular:     Rate and Rhythm: Normal rate and regular rhythm.  Pulmonary:     Effort: Pulmonary effort is normal.     Breath sounds: Normal breath sounds.  Abdominal:     General: Bowel sounds are normal. There is no distension.     Palpations: Abdomen is soft.  Musculoskeletal:     Right lower leg: No edema.     Left lower leg: No edema.  Skin:    General: Skin is warm and dry.     Coloration: Skin is not pale.  Neurological:     Coordination: Coordination normal.  Psychiatric:        Behavior: Behavior normal.        Thought Content: Thought content normal.        Judgment: Judgment normal.       Assessment & Plan:   Problem List Items Addressed This Visit      Cardiovascular and Mediastinum   Atherosclerosis of coronary artery   Relevant Medications   hydrALAZINE (APRESOLINE) 100 MG tablet   isosorbide mononitrate (IMDUR) 30 MG 24 hr tablet   Essential (primary) hypertension    Controlled today; continue to limit salt; no changes to medicines      Relevant Medications   hydrALAZINE (APRESOLINE) 100 MG tablet   isosorbide mononitrate (IMDUR) 30 MG 24 hr tablet   CAD S/P percutaneous coronary angioplasty     Continue to f/u with cardiologist; he is  symptom-free      Relevant Medications   hydrALAZINE (APRESOLINE) 100 MG tablet   isosorbide mononitrate (IMDUR) 30 MG 24 hr tablet     Genitourinary   CKD (chronic kidney disease)    Following nephrologist; glad he is limiting red meat; avoiding NSAIDs, eating better        Other   Prediabetes    Follow A1c and glucose every 6-12 months; glad he is watching his diet      Hyperlipidemia    Limit saturated fats; continue med; check lipids, goal LDL less than 70      Relevant Medications   hydrALAZINE (APRESOLINE) 100 MG tablet   isosorbide mononitrate (IMDUR) 30 MG 24 hr tablet   Elevated PSA    Age 76, nephrologist does not think patient needs to see urologist          Follow up plan: No follow-ups on file.  An after-visit summary was printed and given to the patient at [facility].  Please see the patient instructions which may contain other information and recommendations beyond what is mentioned above in the assessment and plan.  Meds ordered this encounter  Medications  . hydrALAZINE (APRESOLINE) 100 MG tablet    Sig: Take 1 tablet (100 mg total) by mouth 3 (three) times daily. Reported on 12/16/2015    Dispense:  270 tablet    Refill:  3  . isosorbide mononitrate (IMDUR) 30 MG 24 hr tablet    Sig: Take 1 tablet (30 mg total) by mouth daily.    Dispense:  90 tablet    Refill:  3    No orders of the defined types were placed in this encounter.

## 2018-11-12 NOTE — Assessment & Plan Note (Signed)
Age 77, nephrologist does not think patient needs to see urologist

## 2018-11-12 NOTE — Assessment & Plan Note (Signed)
Follow A1c and glucose every 6-12 months; glad he is watching his diet

## 2018-11-12 NOTE — Assessment & Plan Note (Signed)
Following nephrologist; glad he is limiting red meat; avoiding NSAIDs, eating better

## 2018-11-12 NOTE — Assessment & Plan Note (Signed)
Continue to f/u with cardiologist; he is symptom-free

## 2018-11-12 NOTE — Assessment & Plan Note (Signed)
Controlled today; continue to limit salt; no changes to medicines

## 2018-11-12 NOTE — Assessment & Plan Note (Signed)
Limit saturated fats; continue med; check lipids, goal LDL less than 70

## 2018-11-30 ENCOUNTER — Other Ambulatory Visit: Payer: Self-pay | Admitting: Family Medicine

## 2018-11-30 MED ORDER — ISOSORBIDE MONONITRATE ER 30 MG PO TB24: 30.0000 mg | ORAL_TABLET | Freq: Every day | ORAL | 3 refills | Status: AC

## 2018-11-30 NOTE — Telephone Encounter (Signed)
Pt needs refill on Isosorbide mononitrate to be sent to [facility] [address]

## 2018-11-30 NOTE — Telephone Encounter (Signed)
Former pharmacy closed please resend to [facility]

## 2018-12-04 DIAGNOSIS — R0602 Shortness of breath: Secondary | ICD-10-CM | POA: Diagnosis not present

## 2018-12-04 DIAGNOSIS — I509 Heart failure, unspecified: Secondary | ICD-10-CM | POA: Diagnosis not present

## 2018-12-04 DIAGNOSIS — I2510 Atherosclerotic heart disease of native coronary artery without angina pectoris: Secondary | ICD-10-CM | POA: Diagnosis not present

## 2018-12-11 DIAGNOSIS — R079 Chest pain, unspecified: Secondary | ICD-10-CM | POA: Diagnosis not present

## 2018-12-11 DIAGNOSIS — R0602 Shortness of breath: Secondary | ICD-10-CM | POA: Diagnosis not present

## 2018-12-14 DIAGNOSIS — I272 Pulmonary hypertension, unspecified: Secondary | ICD-10-CM | POA: Diagnosis not present

## 2018-12-14 DIAGNOSIS — I10 Essential (primary) hypertension: Secondary | ICD-10-CM | POA: Diagnosis not present

## 2018-12-14 DIAGNOSIS — E785 Hyperlipidemia, unspecified: Secondary | ICD-10-CM | POA: Diagnosis not present

## 2018-12-14 DIAGNOSIS — I340 Nonrheumatic mitral (valve) insufficiency: Secondary | ICD-10-CM | POA: Diagnosis not present

## 2018-12-14 DIAGNOSIS — N183 Chronic kidney disease, stage 3 (moderate): Secondary | ICD-10-CM | POA: Diagnosis not present

## 2018-12-25 DIAGNOSIS — R809 Proteinuria, unspecified: Secondary | ICD-10-CM | POA: Diagnosis not present

## 2018-12-25 DIAGNOSIS — I129 Hypertensive chronic kidney disease with stage 1 through stage 4 chronic kidney disease, or unspecified chronic kidney disease: Secondary | ICD-10-CM | POA: Diagnosis not present

## 2018-12-25 DIAGNOSIS — N184 Chronic kidney disease, stage 4 (severe): Secondary | ICD-10-CM | POA: Diagnosis not present

## 2018-12-25 DIAGNOSIS — D631 Anemia in chronic kidney disease: Secondary | ICD-10-CM | POA: Diagnosis not present

## 2018-12-25 DIAGNOSIS — E1122 Type 2 diabetes mellitus with diabetic chronic kidney disease: Secondary | ICD-10-CM | POA: Diagnosis not present

## 2018-12-26 DIAGNOSIS — N184 Chronic kidney disease, stage 4 (severe): Secondary | ICD-10-CM | POA: Diagnosis not present

## 2018-12-26 DIAGNOSIS — I129 Hypertensive chronic kidney disease with stage 1 through stage 4 chronic kidney disease, or unspecified chronic kidney disease: Secondary | ICD-10-CM | POA: Diagnosis not present

## 2018-12-26 DIAGNOSIS — D631 Anemia in chronic kidney disease: Secondary | ICD-10-CM | POA: Diagnosis not present

## 2018-12-26 DIAGNOSIS — R809 Proteinuria, unspecified: Secondary | ICD-10-CM | POA: Diagnosis not present

## 2018-12-26 DIAGNOSIS — E1122 Type 2 diabetes mellitus with diabetic chronic kidney disease: Secondary | ICD-10-CM | POA: Diagnosis not present

## 2019-01-22 DIAGNOSIS — R809 Proteinuria, unspecified: Secondary | ICD-10-CM | POA: Diagnosis not present

## 2019-01-22 DIAGNOSIS — N185 Chronic kidney disease, stage 5: Secondary | ICD-10-CM | POA: Diagnosis not present

## 2019-01-22 DIAGNOSIS — I129 Hypertensive chronic kidney disease with stage 1 through stage 4 chronic kidney disease, or unspecified chronic kidney disease: Secondary | ICD-10-CM | POA: Diagnosis not present

## 2019-01-22 DIAGNOSIS — D631 Anemia in chronic kidney disease: Secondary | ICD-10-CM | POA: Diagnosis not present

## 2019-01-22 DIAGNOSIS — E1122 Type 2 diabetes mellitus with diabetic chronic kidney disease: Secondary | ICD-10-CM | POA: Diagnosis not present

## 2019-01-23 DIAGNOSIS — D631 Anemia in chronic kidney disease: Secondary | ICD-10-CM | POA: Diagnosis not present

## 2019-01-23 DIAGNOSIS — N185 Chronic kidney disease, stage 5: Secondary | ICD-10-CM | POA: Diagnosis not present

## 2019-01-23 DIAGNOSIS — E1122 Type 2 diabetes mellitus with diabetic chronic kidney disease: Secondary | ICD-10-CM | POA: Diagnosis not present

## 2019-01-23 DIAGNOSIS — I129 Hypertensive chronic kidney disease with stage 1 through stage 4 chronic kidney disease, or unspecified chronic kidney disease: Secondary | ICD-10-CM | POA: Diagnosis not present

## 2019-01-23 DIAGNOSIS — R809 Proteinuria, unspecified: Secondary | ICD-10-CM | POA: Diagnosis not present

## 2019-02-06 ENCOUNTER — Ambulatory Visit (INDEPENDENT_AMBULATORY_CARE_PROVIDER_SITE_OTHER): Payer: Medicare Other | Admitting: Family Medicine

## 2019-02-06 ENCOUNTER — Encounter: Payer: Self-pay | Admitting: Family Medicine

## 2019-02-06 DIAGNOSIS — E782 Mixed hyperlipidemia: Secondary | ICD-10-CM

## 2019-02-06 DIAGNOSIS — N184 Chronic kidney disease, stage 4 (severe): Secondary | ICD-10-CM | POA: Insufficient documentation

## 2019-02-06 DIAGNOSIS — K219 Gastro-esophageal reflux disease without esophagitis: Secondary | ICD-10-CM

## 2019-02-06 DIAGNOSIS — G4700 Insomnia, unspecified: Secondary | ICD-10-CM

## 2019-02-06 DIAGNOSIS — I2510 Atherosclerotic heart disease of native coronary artery without angina pectoris: Secondary | ICD-10-CM

## 2019-02-06 DIAGNOSIS — I10 Essential (primary) hypertension: Secondary | ICD-10-CM

## 2019-02-06 DIAGNOSIS — Z9861 Coronary angioplasty status: Secondary | ICD-10-CM

## 2019-02-06 DIAGNOSIS — I252 Old myocardial infarction: Secondary | ICD-10-CM

## 2019-02-06 DIAGNOSIS — R7303 Prediabetes: Secondary | ICD-10-CM | POA: Diagnosis not present

## 2019-02-06 NOTE — Assessment & Plan Note (Signed)
Controlled; limit salt

## 2019-02-06 NOTE — Patient Instructions (Addendum)
Try to follow the DASH guidelines (DASH stands for Dietary Approaches to Stop Hypertension). Try to limit the sodium in your diet to no more than 1,500mg  of sodium per day. Certainly try to not exceed 2,000 mg per day at the very most. Do not add salt when cooking or at the table.  Check the sodium amount on labels when shopping, and choose items lower in sodium when given a choice. Avoid or limit foods that already contain a lot of sodium. Eat a diet rich in fruits and vegetables and whole grains, and try to lose weight if overweight or obese  If you need something for aches or pains, try to use Tylenol (acetaminophen) instead of non-steroidals (which include Aleve, ibuprofen, Advil, Motrin, and naproxen); non-steroidals can cause long-term kidney damage  Try to lose about 4-5 pounds before your next visit in 3 months Check out the information at familydoctor.org entitled "Nutrition for Weight Loss: What You Need to Know about Fad Diets" Try to lose between 1-2 pounds per week by taking in fewer calories and burning off more calories You can succeed by limiting portions, limiting foods dense in calories and fat, becoming more active, and drinking 8 glasses of water a day (64 ounces) Don't skip meals, especially breakfast, as skipping meals may alter your metabolism Do not use over-the-counter weight loss pills or gimmicks that claim rapid weight loss A healthy BMI (or body mass index) is between 18.5 and 24.9 You can calculate your ideal BMI at the [place] website [employer]fr

## 2019-02-06 NOTE — Assessment & Plan Note (Signed)
Limit saturated fats 

## 2019-02-06 NOTE — Assessment & Plan Note (Signed)
Check A1c and glucose every 6 months

## 2019-02-06 NOTE — Assessment & Plan Note (Signed)
20 years ago; seeing cardiologist

## 2019-02-06 NOTE — Progress Notes (Signed)
BP 128/70   Pulse 66   Temp 98.3 F (36.8 C) (Oral)   Resp 14   Ht 5\' 5"  (1.651 m)   Wt 168 lb 12.8 oz (76.6 kg)   SpO2 97%   BMI 28.09 kg/m    Subjective:    Patient ID: Robert Hansen, male    DOB: [birth year], 78 y.o.   MRN: [id number]  HPI: Robert Hansen is a 78 y.o. male  Chief Complaint  Patient presents with  . Follow-up    HPI Here for f/u He/she specifically denies travel to/from and exposure to others who have traveled to/from [place], [place], [place], [place], or [place]  HTN; controlled today; not checking away form the doctor; he knows to not use salt substitutes; just pepper to season  He does not sleep well; chronic problems; not much caffeine; one cup of coffee in the morning; has a TV in the bedroom; not on social media in the bedroom; he does not snore or stop breathing; wife has not voiced concerns about snoring or apnea; he takes OTC product sometimes that helps; not waking up b/c of prostate problems or urination; he declined counseling  High cholesterol; taking medicines; limiting saturated fats  Lab Results  Component Value Date   CHOL 122 11/02/2018   HDL 45 11/02/2018   LDLCALC 58 11/02/2018   TRIG 109 11/02/2018   CHOLHDL 2.7 11/02/2018   Prediabetes; last A1c showed good control; having dry mouth from denture adhesive; not many sugary drinks, does drink Sprite or ginger ale, one cup a day  Lab Results  Component Value Date   HGBA1C 5.8 (H) 09/29/2018   CKD stage 4; managed by nephrologist, DR. Juleen China; avoiding NSAIDs; hydrating better; labs being done March 24th with kidney doctor  Coronary artery disease; seeing cardiologist, Dr. Humphrey Rolls; no chest pain; just had a stress test and passed he says just in January 2020; he had a heart attack in 2000; runs in the family (mother)  Acid reflux in the list but not a problem  Having nasal congestion, not sure if allergies; hx of facial fractures years ago;   Depression screen [facility] 2/9 02/06/2019  11/02/2018 10/24/2018 11/23/2017 09/27/2017  Decreased Interest 0 0 0 0 0  Down, Depressed, Hopeless 0 0 0 0 0  PHQ - 2 Score 0 0 0 0 0  Altered sleeping 0 0 - - -  Tired, decreased energy 0 0 - - -  Change in appetite 0 0 - - -  Feeling bad or failure about yourself  0 0 - - -  Trouble concentrating 0 0 - - -  Moving slowly or fidgety/restless 0 0 - - -  Suicidal thoughts 0 0 - - -  PHQ-9 Score 0 0 - - -  Difficult doing work/chores Not difficult at all Not difficult at all - - -   Fall Risk  02/06/2019 11/02/2018 10/24/2018 11/23/2017 09/27/2017  Falls in the past year? 0 0 0 No No  Number falls in past yr: 0 - 0 - -  Injury with Fall? 0 - - - -    Relevant past medical, surgical, family and social history reviewed Past Medical History:  Diagnosis Date  . Allergy   . Arthritis   . Cancer ([place]) 2011   colon, SIGMOID COLON RESECTION: INVASIVE ADENOCARCINOMA  . Cholelithiasis   . Chronic kidney disease    CREAT 1.9 3 YEARS AGO  . Colon polyp 11/02/2011   tubular adenoma  .  Coronary artery disease   . Diabetes mellitus without complication (HCC)    borderline  . GERD (gastroesophageal reflux disease)   . Heart murmur   . Hyperlipidemia   . Hypertension    Past Surgical History:  Procedure Laterality Date  . CHOLECYSTECTOMY N/A 04/19/2016   Procedure: LAPAROSCOPIC CHOLECYSTECTOMY;  Surgeon: Clayburn Pert, MD;  Location: [facility] ORS;  Service: General;  Laterality: N/A;  . COLON SURGERY  07/15/2010   Dr. Pat Patrick, [facility], SIGMOID COLON RESECTION:   . COLONOSCOPY  11/02/2011   Dr Pat Patrick  . COLONOSCOPY WITH PROPOFOL N/A 01/11/2017   Procedure: COLONOSCOPY WITH PROPOFOL;  Surgeon: Robert Bellow, MD;  Location[facility] ENDOSCOPY;  Service: Endoscopy;  Laterality: N/A;  . CORONARY ANGIOPLASTY WITH STENT PLACEMENT  2000   Dr. Humphrey Rolls[facility]   Family History  Problem Relation Age of Onset  . Diabetes Mother   . Breast cancer Mother        breast  . Heart disease Mother    Social History    Tobacco Use  . Smoking status: Never Smoker  . Smokeless tobacco: Never Used  Substance Use Topics  . Alcohol use: No    Alcohol/week: 0.0 standard drinks    Frequency: Never  . Drug use: No     [facility] Visit from 02/06/2019 in [facility]  AUDIT-C Score  0      Interim medical history since last visit reviewed. Allergies and medications reviewed  Review of Systems Per HPI unless specifically indicated above     Objective:    BP 128/70   Pulse 66   Temp 98.3 F (36.8 C) (Oral)   Resp 14   Ht 5\' 5"  (1.651 m)   Wt 168 lb 12.8 oz (76.6 kg)   SpO2 97%   BMI 28.09 kg/m   Wt Readings from Last 3 Encounters:  02/06/19 168 lb 12.8 oz (76.6 kg)  11/02/18 161 lb 8 oz (73.3 kg)  10/24/18 167 lb (75.8 kg)    Physical Exam Constitutional:      General: He is not in acute distress.    Appearance: He is well-developed.  HENT:     Head: Normocephalic and atraumatic.  Eyes:     General: No scleral icterus. Neck:     Thyroid: No thyromegaly.  Cardiovascular:     Rate and Rhythm: Normal rate and regular rhythm.  Pulmonary:     Effort: Pulmonary effort is normal.     Breath sounds: Normal breath sounds.  Abdominal:     General: Bowel sounds are normal. There is no distension.     Palpations: Abdomen is soft.  Skin:    General: Skin is warm and dry.     Coloration: Skin is not pale.  Neurological:     Coordination: Coordination normal.  Psychiatric:        Behavior: Behavior normal.        Thought Content: Thought content normal.        Judgment: Judgment normal.     Results for orders placed or performed in visit on 11/02/18  Lipid panel  Result Value Ref Range   Cholesterol 122 <200 mg/dL   HDL 45 >40 mg/dL   Triglycerides 109 <150 mg/dL   LDL Cholesterol (Calc) 58 mg/dL (calc)   Total CHOL/HDL Ratio 2.7 <5.0 (calc)   Non-HDL Cholesterol (Calc) 77 <130 mg/dL (calc)      Assessment & Plan:   Problem List Items Addressed This Visit  Cardiovascular and Mediastinum   Essential (primary) hypertension    Controlled; limit salt      CAD S/P percutaneous coronary angioplasty    Following with cardiologist        Digestive   Acid reflux    No symptoms        Genitourinary   CKD (chronic kidney disease) stage 4, GFR 15-29 ml/min (HCC)    Seeing nephrologist; avoiding NSAIDs; kidney doctor has talked with him about dialysis in the future apparently        Other   Prediabetes    Check A1c and glucose every 6 months      Insomnia    Patient declines referral to therapist for CBT-insomnia; I'll suggest getting the TV out of the bedroom; no electronics for two hours before bed      Hyperlipidemia    Limit saturated fats      H/O acute myocardial infarction    20 years ago; seeing cardiologist          Follow up plan: Return in about 3 months (around 05/07/2019) for follow-up visit with Dr. Sanda Klein and labs.  An after-visit summary was printed and given to the patient at [facility].  Please see the patient instructions which may contain other information and recommendations beyond what is mentioned above in the assessment and plan.  No orders of the defined types were placed in this encounter.   No orders of the defined types were placed in this encounter.

## 2019-02-06 NOTE — Assessment & Plan Note (Signed)
No symptoms 

## 2019-02-06 NOTE — Assessment & Plan Note (Signed)
Patient declines referral to therapist for CBT-insomnia; I'll suggest getting the TV out of the bedroom; no electronics for two hours before bed

## 2019-02-06 NOTE — Assessment & Plan Note (Signed)
Seeing nephrologist; avoiding NSAIDs; kidney doctor has talked with him about dialysis in the future apparently

## 2019-02-06 NOTE — Assessment & Plan Note (Signed)
Following with cardiologist

## 2019-02-12 DIAGNOSIS — N184 Chronic kidney disease, stage 4 (severe): Secondary | ICD-10-CM | POA: Diagnosis not present

## 2019-02-12 DIAGNOSIS — I10 Essential (primary) hypertension: Secondary | ICD-10-CM | POA: Diagnosis not present

## 2019-02-12 DIAGNOSIS — I2510 Atherosclerotic heart disease of native coronary artery without angina pectoris: Secondary | ICD-10-CM | POA: Diagnosis not present

## 2019-02-12 DIAGNOSIS — E785 Hyperlipidemia, unspecified: Secondary | ICD-10-CM | POA: Diagnosis not present

## 2019-02-14 IMAGING — US US RENAL
1 series · 14 of 25 positions shown · non-contrast
Comparison: CT abdomen and pelvis 07/17/2015

CLINICAL DATA: Acute kidney injury; history coronary artery
disease, colon cancer, diabetes mellitus, hypertension

EXAM:
RENAL / URINARY TRACT ULTRASOUND COMPLETE

[Series 1: us renal · 0.24mm/px · 14 of 47 slices shown]
[im 1/47]
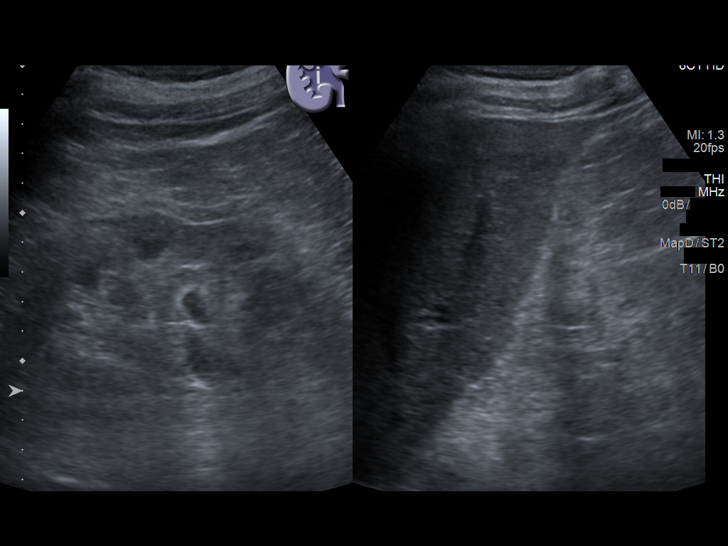
[im 4/47]
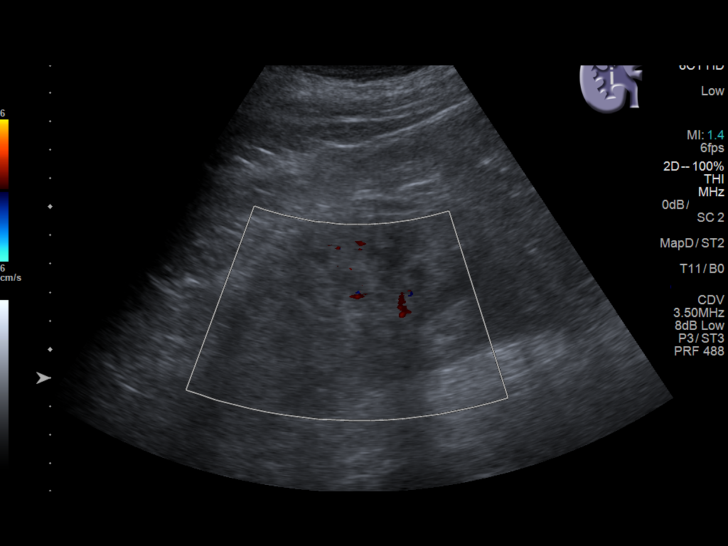
[im 8/47]
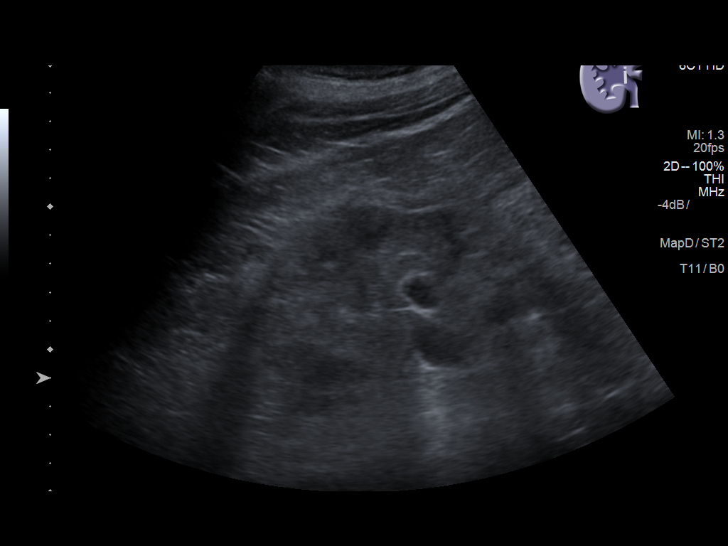
[im 12/47]
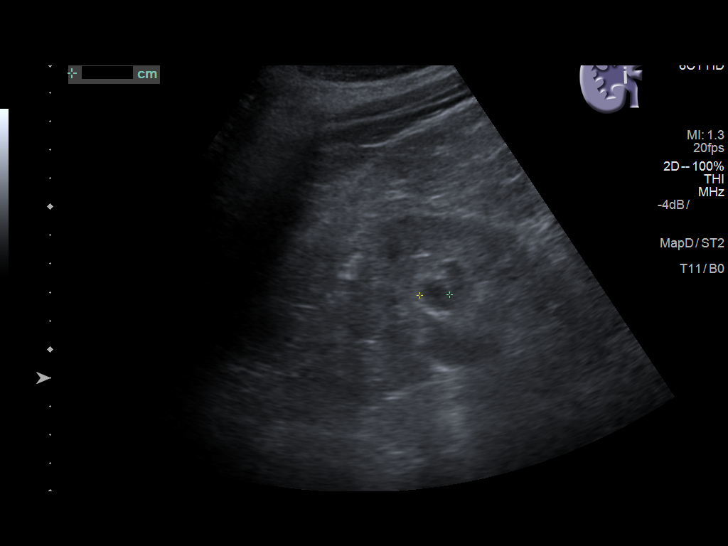
[im 16/47]
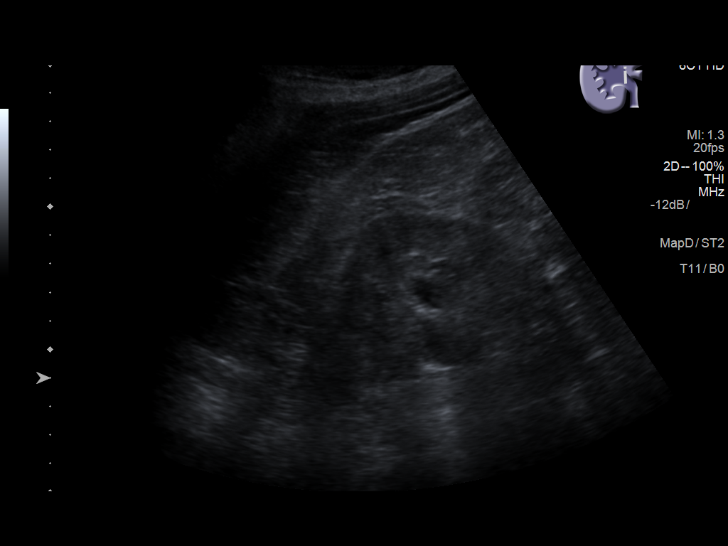
[im 18/47]
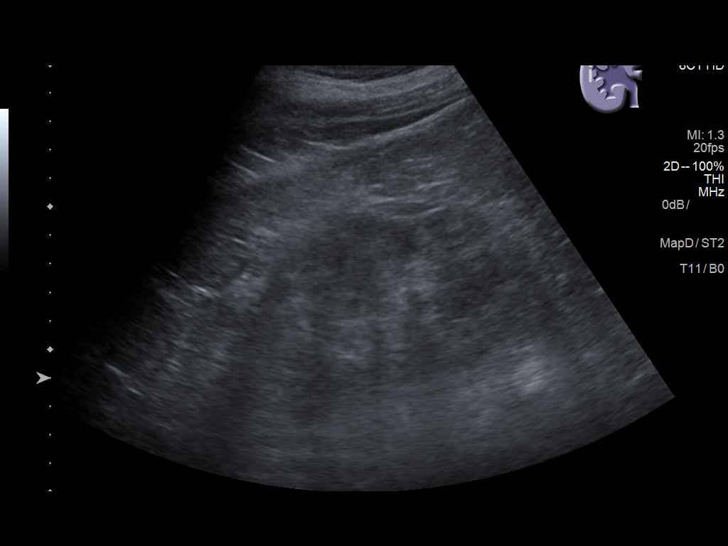
[im 22/47]
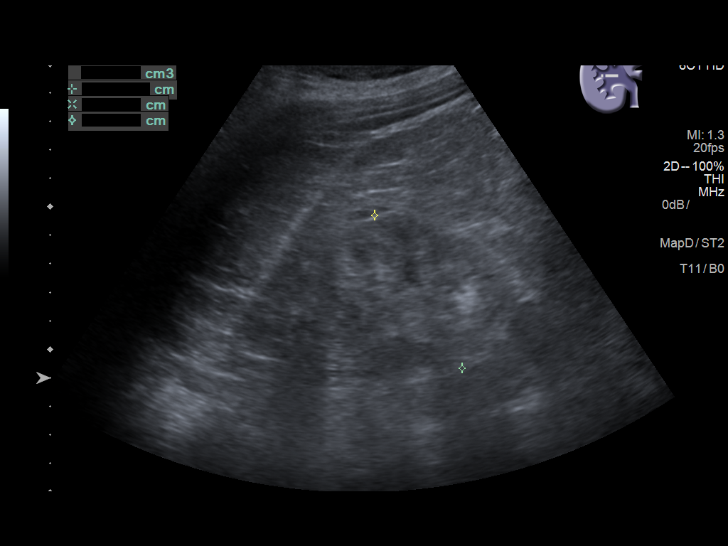
[im 25/47]
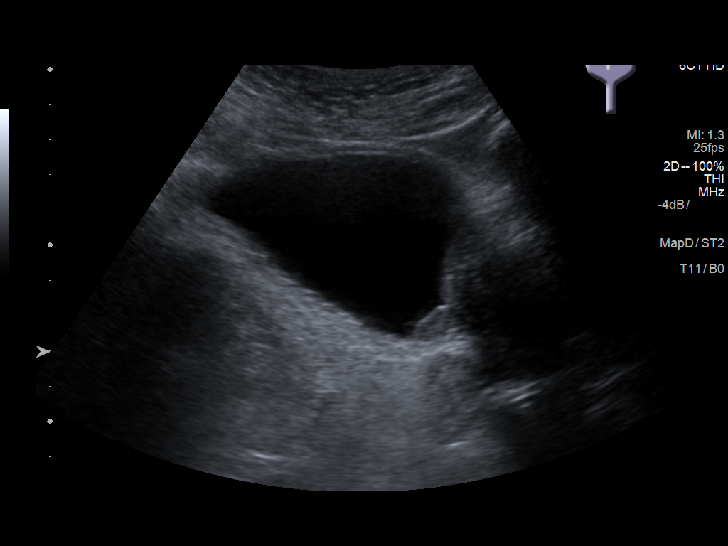
[im 29/47]
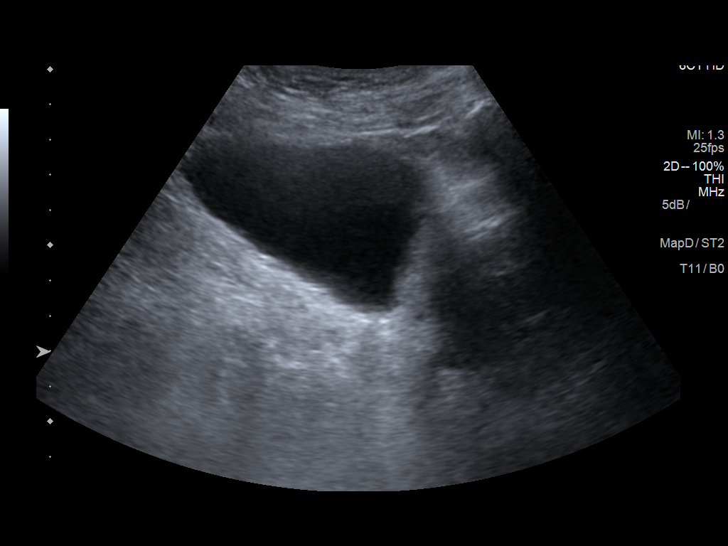
[im 31/47]
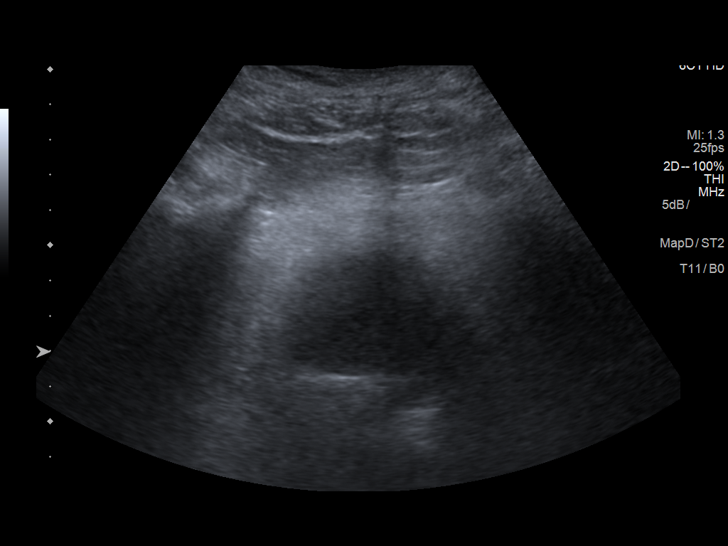
[im 35/47]
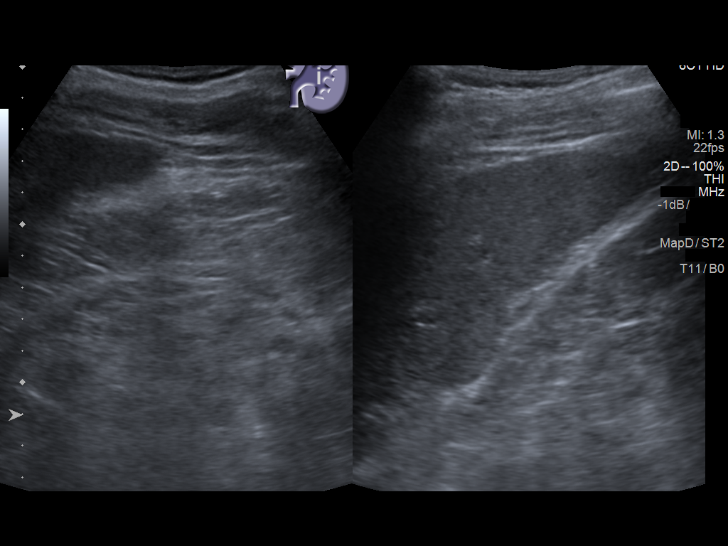
[im 39/47]
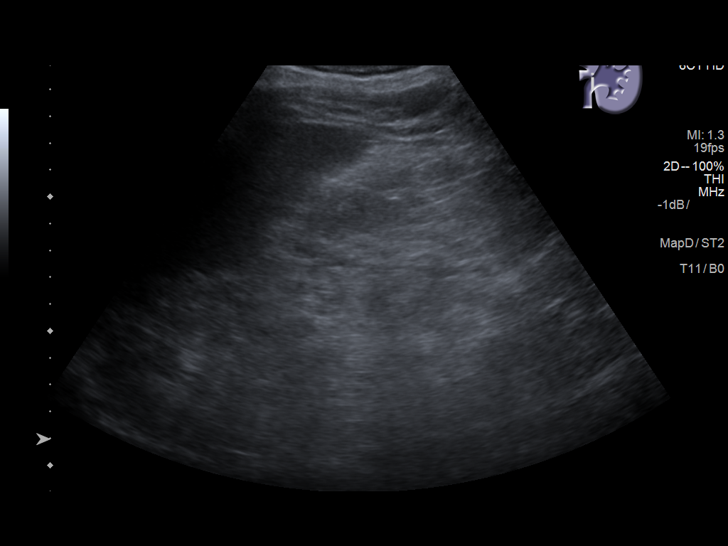
[im 43/47]
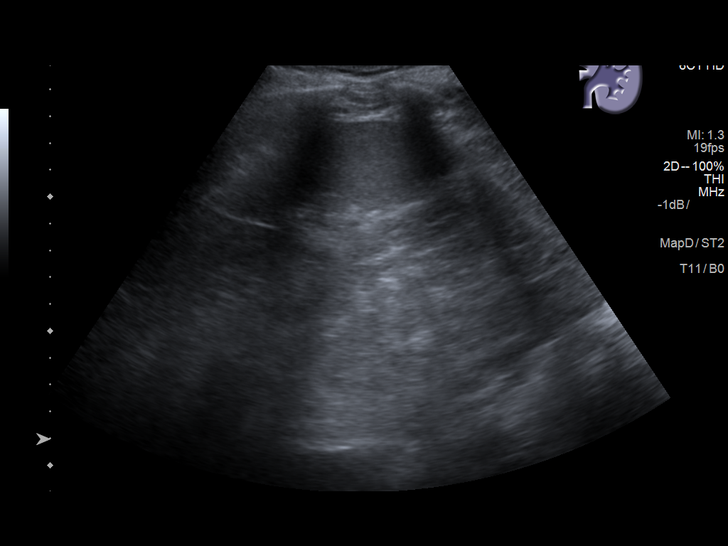
[im 47/47]
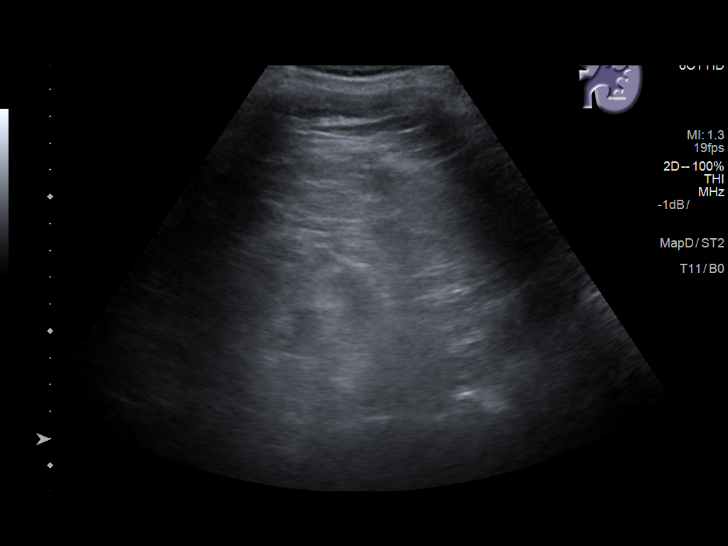

[14 of 25 positions shown; findings below may reference images not displayed]

FINDINGS: Right Kidney:

Renal measurements: 11.0 x 6.6 x 6.2 cm = volume: 236 mL. Marked
cortical thinning. Increased cortical echogenicity. No
hydronephrosis or shadowing calcification. Two small peripelvic
cysts identified, 13 x 13 x 10 mm and 12 x 21 x 14 mm, seen on
previous exam as well.

Left Kidney:

Renal measurements: 10.5 x 4.9 x 4.4 cm = volume: 120 mL. Marked
cortical thinning. Increased cortical echogenicity. No mass,
hydronephrosis or shadowing calcification.

Bladder:

Appears normal for degree of bladder distention.

Prostate gland enlargement: 5.3 x 3.5 x 5.1 cm.  Volume 50 mL.
IMPRESSION: Marked renal cortical atrophy and medical renal disease changes of
both kidneys.

Small peripelvic renal cysts are present bilaterally on the prior CT
exam but only adequately visualized at the RIGHT kidney on
sonography.

Significant prostatic enlargement.

## 2019-02-20 DIAGNOSIS — I129 Hypertensive chronic kidney disease with stage 1 through stage 4 chronic kidney disease, or unspecified chronic kidney disease: Secondary | ICD-10-CM | POA: Diagnosis not present

## 2019-02-20 DIAGNOSIS — N184 Chronic kidney disease, stage 4 (severe): Secondary | ICD-10-CM | POA: Diagnosis not present

## 2019-02-20 DIAGNOSIS — R809 Proteinuria, unspecified: Secondary | ICD-10-CM | POA: Diagnosis not present

## 2019-02-20 DIAGNOSIS — N179 Acute kidney failure, unspecified: Secondary | ICD-10-CM | POA: Diagnosis not present

## 2019-02-20 DIAGNOSIS — D631 Anemia in chronic kidney disease: Secondary | ICD-10-CM | POA: Diagnosis not present

## 2019-02-28 DIAGNOSIS — R809 Proteinuria, unspecified: Secondary | ICD-10-CM | POA: Diagnosis not present

## 2019-02-28 DIAGNOSIS — N2581 Secondary hyperparathyroidism of renal origin: Secondary | ICD-10-CM | POA: Diagnosis not present

## 2019-02-28 DIAGNOSIS — I120 Hypertensive chronic kidney disease with stage 5 chronic kidney disease or end stage renal disease: Secondary | ICD-10-CM | POA: Diagnosis not present

## 2019-02-28 DIAGNOSIS — N185 Chronic kidney disease, stage 5: Secondary | ICD-10-CM | POA: Diagnosis not present

## 2019-02-28 DIAGNOSIS — D631 Anemia in chronic kidney disease: Secondary | ICD-10-CM | POA: Diagnosis not present

## 2019-03-06 ENCOUNTER — Encounter: Payer: Self-pay | Admitting: Family Medicine

## 2019-04-16 DIAGNOSIS — I2510 Atherosclerotic heart disease of native coronary artery without angina pectoris: Secondary | ICD-10-CM | POA: Diagnosis not present

## 2019-04-16 DIAGNOSIS — I10 Essential (primary) hypertension: Secondary | ICD-10-CM | POA: Diagnosis not present

## 2019-04-16 DIAGNOSIS — Z9861 Coronary angioplasty status: Secondary | ICD-10-CM | POA: Diagnosis not present

## 2019-04-16 DIAGNOSIS — I509 Heart failure, unspecified: Secondary | ICD-10-CM | POA: Diagnosis not present

## 2019-04-23 DIAGNOSIS — I2510 Atherosclerotic heart disease of native coronary artery without angina pectoris: Secondary | ICD-10-CM | POA: Diagnosis not present

## 2019-04-23 DIAGNOSIS — K210 Gastro-esophageal reflux disease with esophagitis: Secondary | ICD-10-CM | POA: Diagnosis not present

## 2019-04-23 DIAGNOSIS — N184 Chronic kidney disease, stage 4 (severe): Secondary | ICD-10-CM | POA: Diagnosis not present

## 2019-04-23 DIAGNOSIS — I10 Essential (primary) hypertension: Secondary | ICD-10-CM | POA: Diagnosis not present

## 2019-04-27 DIAGNOSIS — N179 Acute kidney failure, unspecified: Secondary | ICD-10-CM | POA: Diagnosis not present

## 2019-04-27 DIAGNOSIS — D631 Anemia in chronic kidney disease: Secondary | ICD-10-CM | POA: Diagnosis not present

## 2019-04-27 DIAGNOSIS — R809 Proteinuria, unspecified: Secondary | ICD-10-CM | POA: Diagnosis not present

## 2019-04-27 DIAGNOSIS — N184 Chronic kidney disease, stage 4 (severe): Secondary | ICD-10-CM | POA: Diagnosis not present

## 2019-04-27 DIAGNOSIS — I129 Hypertensive chronic kidney disease with stage 1 through stage 4 chronic kidney disease, or unspecified chronic kidney disease: Secondary | ICD-10-CM | POA: Diagnosis not present

## 2019-04-30 DIAGNOSIS — I129 Hypertensive chronic kidney disease with stage 1 through stage 4 chronic kidney disease, or unspecified chronic kidney disease: Secondary | ICD-10-CM | POA: Diagnosis not present

## 2019-04-30 DIAGNOSIS — N184 Chronic kidney disease, stage 4 (severe): Secondary | ICD-10-CM | POA: Diagnosis not present

## 2019-04-30 DIAGNOSIS — R809 Proteinuria, unspecified: Secondary | ICD-10-CM | POA: Diagnosis not present

## 2019-04-30 DIAGNOSIS — N179 Acute kidney failure, unspecified: Secondary | ICD-10-CM | POA: Diagnosis not present

## 2019-04-30 DIAGNOSIS — D631 Anemia in chronic kidney disease: Secondary | ICD-10-CM | POA: Diagnosis not present

## 2019-05-04 DIAGNOSIS — R809 Proteinuria, unspecified: Secondary | ICD-10-CM | POA: Diagnosis not present

## 2019-05-04 DIAGNOSIS — D631 Anemia in chronic kidney disease: Secondary | ICD-10-CM | POA: Diagnosis not present

## 2019-05-04 DIAGNOSIS — N2581 Secondary hyperparathyroidism of renal origin: Secondary | ICD-10-CM | POA: Diagnosis not present

## 2019-05-04 DIAGNOSIS — I120 Hypertensive chronic kidney disease with stage 5 chronic kidney disease or end stage renal disease: Secondary | ICD-10-CM | POA: Diagnosis not present

## 2019-05-04 DIAGNOSIS — N185 Chronic kidney disease, stage 5: Secondary | ICD-10-CM | POA: Diagnosis not present

## 2019-05-08 ENCOUNTER — Ambulatory Visit: Payer: Medicare Other | Admitting: Family Medicine

## 2019-06-07 DIAGNOSIS — I129 Hypertensive chronic kidney disease with stage 1 through stage 4 chronic kidney disease, or unspecified chronic kidney disease: Secondary | ICD-10-CM | POA: Diagnosis not present

## 2019-06-07 DIAGNOSIS — N184 Chronic kidney disease, stage 4 (severe): Secondary | ICD-10-CM | POA: Diagnosis not present

## 2019-06-07 DIAGNOSIS — D631 Anemia in chronic kidney disease: Secondary | ICD-10-CM | POA: Diagnosis not present

## 2019-06-07 DIAGNOSIS — N179 Acute kidney failure, unspecified: Secondary | ICD-10-CM | POA: Diagnosis not present

## 2019-06-07 DIAGNOSIS — R809 Proteinuria, unspecified: Secondary | ICD-10-CM | POA: Diagnosis not present

## 2019-06-12 DIAGNOSIS — I129 Hypertensive chronic kidney disease with stage 1 through stage 4 chronic kidney disease, or unspecified chronic kidney disease: Secondary | ICD-10-CM | POA: Diagnosis not present

## 2019-06-12 DIAGNOSIS — D631 Anemia in chronic kidney disease: Secondary | ICD-10-CM | POA: Diagnosis not present

## 2019-06-12 DIAGNOSIS — R809 Proteinuria, unspecified: Secondary | ICD-10-CM | POA: Diagnosis not present

## 2019-06-12 DIAGNOSIS — N184 Chronic kidney disease, stage 4 (severe): Secondary | ICD-10-CM | POA: Diagnosis not present

## 2019-06-12 DIAGNOSIS — N179 Acute kidney failure, unspecified: Secondary | ICD-10-CM | POA: Diagnosis not present

## 2019-06-19 DIAGNOSIS — I120 Hypertensive chronic kidney disease with stage 5 chronic kidney disease or end stage renal disease: Secondary | ICD-10-CM | POA: Diagnosis not present

## 2019-06-19 DIAGNOSIS — D631 Anemia in chronic kidney disease: Secondary | ICD-10-CM | POA: Diagnosis not present

## 2019-06-19 DIAGNOSIS — N185 Chronic kidney disease, stage 5: Secondary | ICD-10-CM | POA: Diagnosis not present

## 2019-06-19 DIAGNOSIS — R809 Proteinuria, unspecified: Secondary | ICD-10-CM | POA: Diagnosis not present

## 2019-06-25 DIAGNOSIS — N184 Chronic kidney disease, stage 4 (severe): Secondary | ICD-10-CM | POA: Diagnosis not present

## 2019-06-25 DIAGNOSIS — I272 Pulmonary hypertension, unspecified: Secondary | ICD-10-CM | POA: Diagnosis not present

## 2019-06-25 DIAGNOSIS — I739 Peripheral vascular disease, unspecified: Secondary | ICD-10-CM | POA: Diagnosis not present

## 2019-06-25 DIAGNOSIS — I2510 Atherosclerotic heart disease of native coronary artery without angina pectoris: Secondary | ICD-10-CM | POA: Diagnosis not present

## 2019-06-26 ENCOUNTER — Encounter: Payer: Self-pay | Admitting: General Surgery

## 2019-06-29 ENCOUNTER — Encounter: Payer: Self-pay | Admitting: Family Medicine

## 2019-08-09 DIAGNOSIS — N179 Acute kidney failure, unspecified: Secondary | ICD-10-CM | POA: Diagnosis not present

## 2019-08-09 DIAGNOSIS — D631 Anemia in chronic kidney disease: Secondary | ICD-10-CM | POA: Diagnosis not present

## 2019-08-09 DIAGNOSIS — I129 Hypertensive chronic kidney disease with stage 1 through stage 4 chronic kidney disease, or unspecified chronic kidney disease: Secondary | ICD-10-CM | POA: Diagnosis not present

## 2019-08-09 DIAGNOSIS — R809 Proteinuria, unspecified: Secondary | ICD-10-CM | POA: Diagnosis not present

## 2019-08-09 DIAGNOSIS — N184 Chronic kidney disease, stage 4 (severe): Secondary | ICD-10-CM | POA: Diagnosis not present

## 2019-08-15 DIAGNOSIS — D631 Anemia in chronic kidney disease: Secondary | ICD-10-CM | POA: Diagnosis not present

## 2019-08-15 DIAGNOSIS — I129 Hypertensive chronic kidney disease with stage 1 through stage 4 chronic kidney disease, or unspecified chronic kidney disease: Secondary | ICD-10-CM | POA: Diagnosis not present

## 2019-08-15 DIAGNOSIS — N2581 Secondary hyperparathyroidism of renal origin: Secondary | ICD-10-CM | POA: Diagnosis not present

## 2019-08-15 DIAGNOSIS — N185 Chronic kidney disease, stage 5: Secondary | ICD-10-CM | POA: Diagnosis not present

## 2019-08-15 DIAGNOSIS — E1122 Type 2 diabetes mellitus with diabetic chronic kidney disease: Secondary | ICD-10-CM | POA: Diagnosis not present

## 2019-08-30 DEATH — deceased

## 2020-01-29 ENCOUNTER — Telehealth: Payer: Self-pay

## 2020-01-29 NOTE — Telephone Encounter (Signed)
Message left for the patient to see where he would like to get his colonoscopy done through. His last colonoscopy was in 2018 by Dr Bary Castilla. Dr Bary Castilla is no longer with this office and he would need to be referred to another provider to have this completed.
